# Patient Record
Sex: Female | Born: 1975 | Race: Asian | Hispanic: No | Marital: Married | Smoking: Never smoker
Health system: Southern US, Community
[De-identification: ages and names within clinical notes are randomized; demographics above are authoritative.]

## PROBLEM LIST (undated history)

## (undated) ENCOUNTER — Inpatient Hospital Stay (HOSPITAL_COMMUNITY): Payer: BC Managed Care – PPO

## (undated) DIAGNOSIS — Z789 Other specified health status: Secondary | ICD-10-CM

## (undated) HISTORY — PX: LAPAROSCOPY FOR ECTOPIC PREGNANCY: SUR765

---

## 2013-01-20 ENCOUNTER — Other Ambulatory Visit (HOSPITAL_COMMUNITY): Payer: Self-pay | Admitting: Family Medicine

## 2013-01-20 ENCOUNTER — Ambulatory Visit (HOSPITAL_COMMUNITY)
Admission: RE | Admit: 2013-01-20 | Discharge: 2013-01-20 | Disposition: A | Discharge status: Home / Self Care | Payer: BC Managed Care – PPO | Source: Ambulatory Visit | Attending: Family Medicine | Admitting: Family Medicine

## 2013-01-20 DIAGNOSIS — N949 Unspecified condition associated with female genital organs and menstrual cycle: Secondary | ICD-10-CM | POA: Insufficient documentation

## 2013-01-20 DIAGNOSIS — R102 Pelvic and perineal pain: Secondary | ICD-10-CM

## 2013-05-10 ENCOUNTER — Other Ambulatory Visit: Payer: Self-pay | Admitting: Obstetrics and Gynecology

## 2013-05-10 DIAGNOSIS — R928 Other abnormal and inconclusive findings on diagnostic imaging of breast: Secondary | ICD-10-CM

## 2013-05-24 ENCOUNTER — Ambulatory Visit
Admission: RE | Admit: 2013-05-24 | Discharge: 2013-05-24 | Disposition: A | Discharge status: Home / Self Care | Payer: BC Managed Care – PPO | Source: Ambulatory Visit | Attending: Obstetrics and Gynecology | Admitting: Obstetrics and Gynecology

## 2013-05-24 ENCOUNTER — Other Ambulatory Visit: Payer: BC Managed Care – PPO

## 2013-05-24 DIAGNOSIS — R928 Other abnormal and inconclusive findings on diagnostic imaging of breast: Secondary | ICD-10-CM

## 2014-06-21 ENCOUNTER — Other Ambulatory Visit: Payer: Self-pay | Admitting: Family Medicine

## 2014-06-21 DIAGNOSIS — E069 Thyroiditis, unspecified: Secondary | ICD-10-CM

## 2014-06-23 ENCOUNTER — Ambulatory Visit
Admission: RE | Admit: 2014-06-23 | Discharge: 2014-06-23 | Disposition: A | Discharge status: Home / Self Care | Payer: BC Managed Care – PPO | Source: Ambulatory Visit | Attending: Family Medicine | Admitting: Family Medicine

## 2014-06-23 DIAGNOSIS — E069 Thyroiditis, unspecified: Secondary | ICD-10-CM

## 2014-06-28 ENCOUNTER — Other Ambulatory Visit: Payer: Self-pay | Admitting: Family Medicine

## 2014-06-28 DIAGNOSIS — E041 Nontoxic single thyroid nodule: Secondary | ICD-10-CM

## 2014-07-04 ENCOUNTER — Other Ambulatory Visit (HOSPITAL_COMMUNITY)
Admission: RE | Admit: 2014-07-04 | Discharge: 2014-07-04 | Disposition: A | Discharge status: Home / Self Care | Payer: BC Managed Care – PPO | Source: Ambulatory Visit | Attending: Interventional Radiology | Admitting: Interventional Radiology

## 2014-07-04 ENCOUNTER — Ambulatory Visit
Admission: RE | Admit: 2014-07-04 | Discharge: 2014-07-04 | Disposition: A | Discharge status: Home / Self Care | Payer: BC Managed Care – PPO | Source: Ambulatory Visit | Attending: Family Medicine | Admitting: Family Medicine

## 2014-07-04 DIAGNOSIS — E069 Thyroiditis, unspecified: Secondary | ICD-10-CM | POA: Insufficient documentation

## 2014-07-04 DIAGNOSIS — E041 Nontoxic single thyroid nodule: Secondary | ICD-10-CM

## 2015-03-04 IMAGING — US US SOFT TISSUE HEAD/NECK
1 series · 14 of 25 positions shown · non-contrast
Comparison: None.

CLINICAL DATA: THYROIDITIS

EXAM:
THYROID ULTRASOUND
TECHNIQUE: Ultrasound examination of the thyroid gland and adjacent soft
tissues was performed.

[Series 1: us soft tissue head/neck · 0.08mm/px · 14 of 69 slices shown]
[im 1/69]
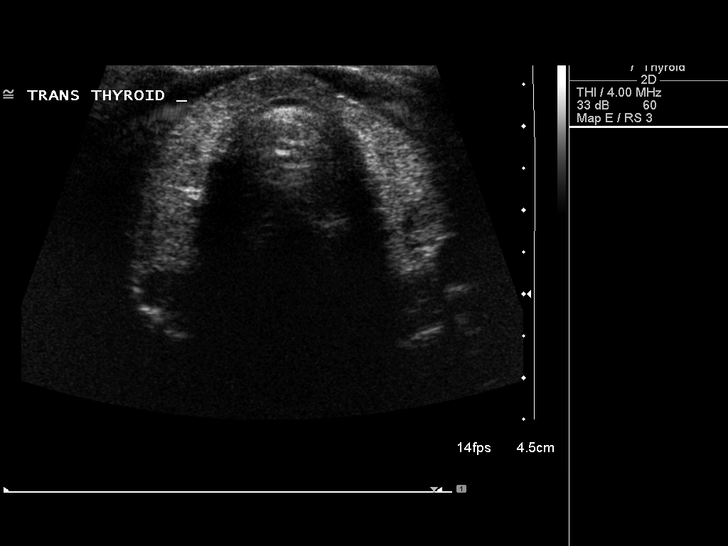
[im 6/69]
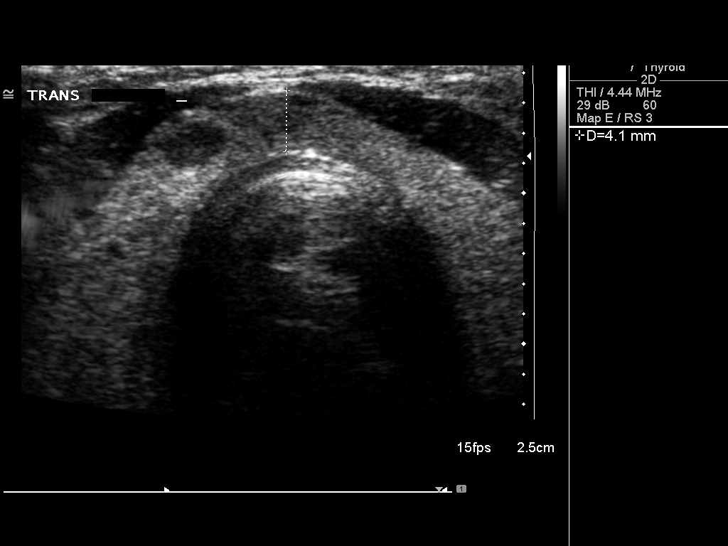
[im 12/69]
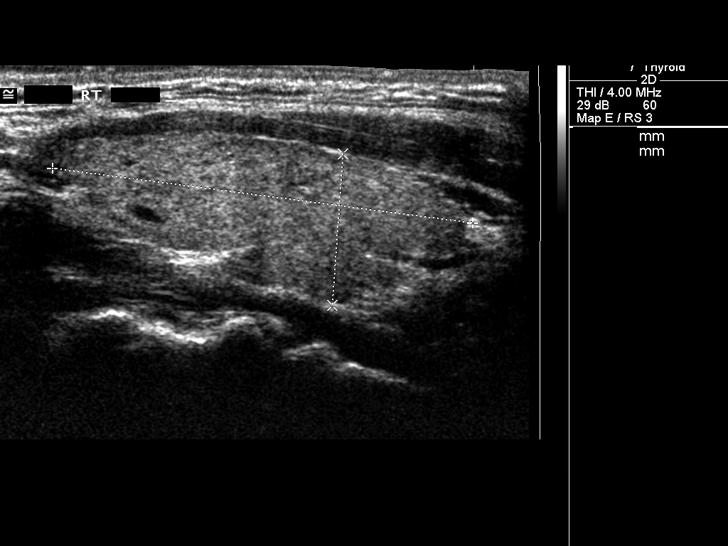
[im 18/69]
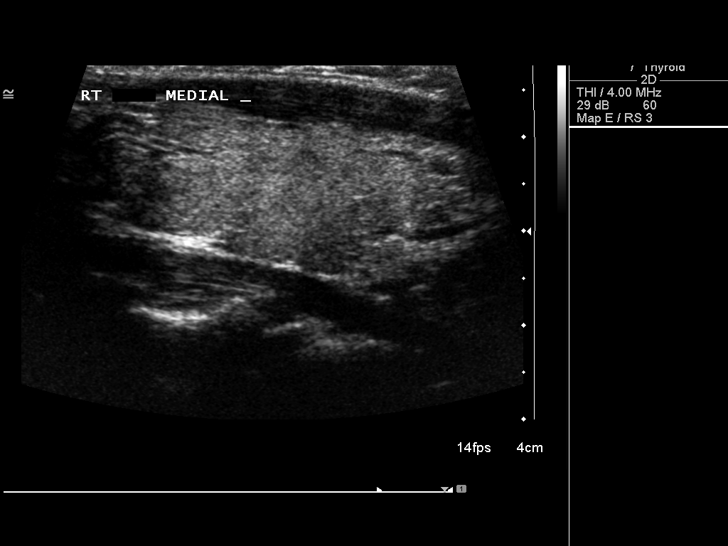
[im 23/69]
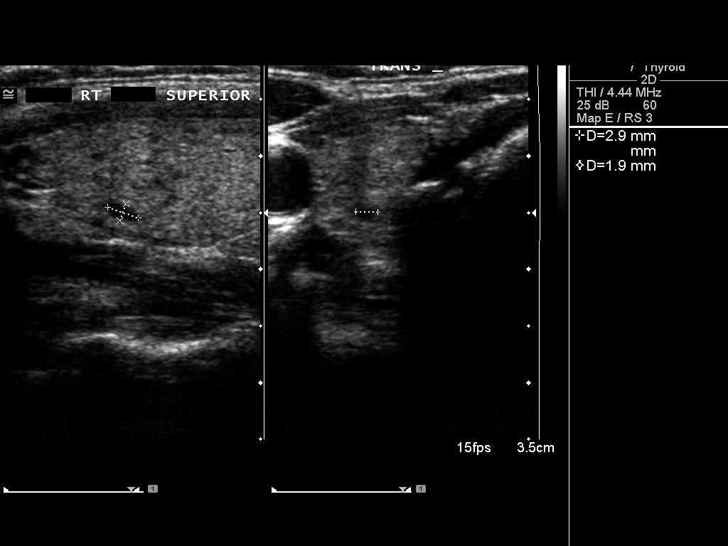
[im 26/69]
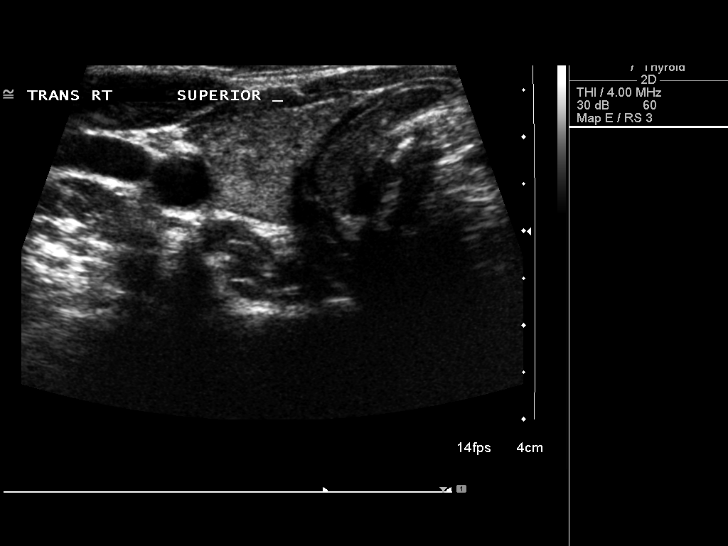
[im 32/69]
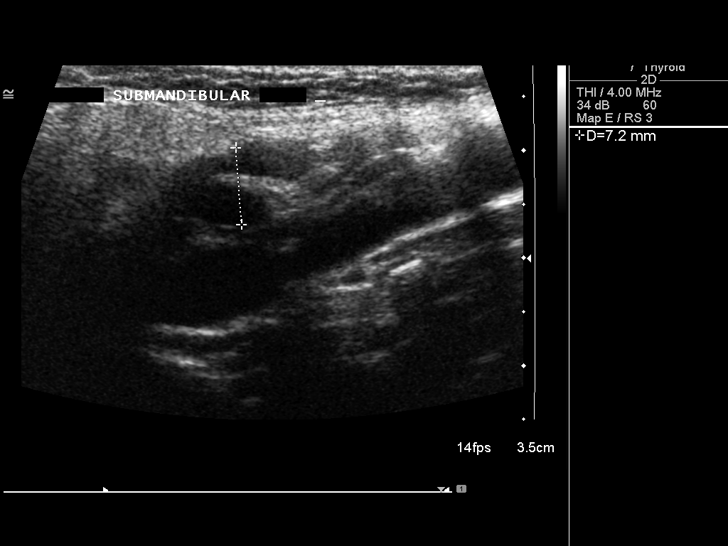
[im 37/69]
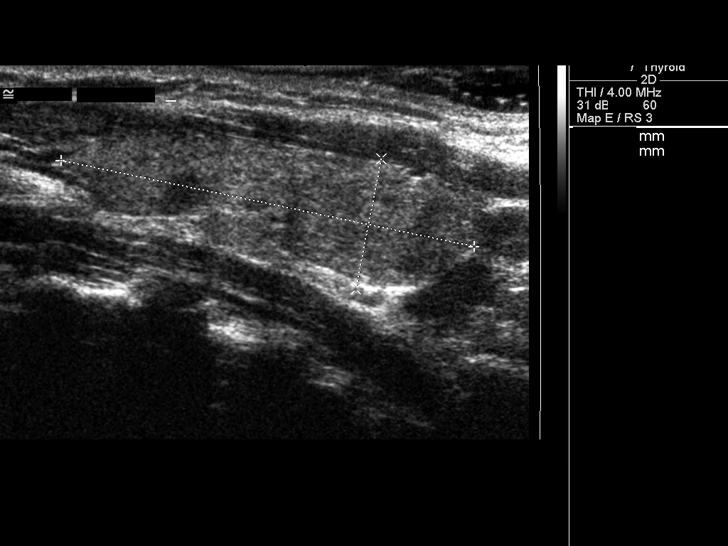
[im 43/69]
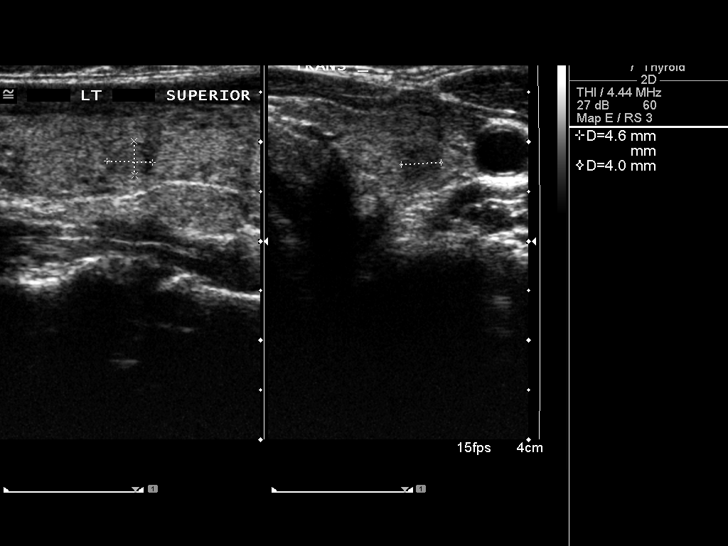
[im 46/69]
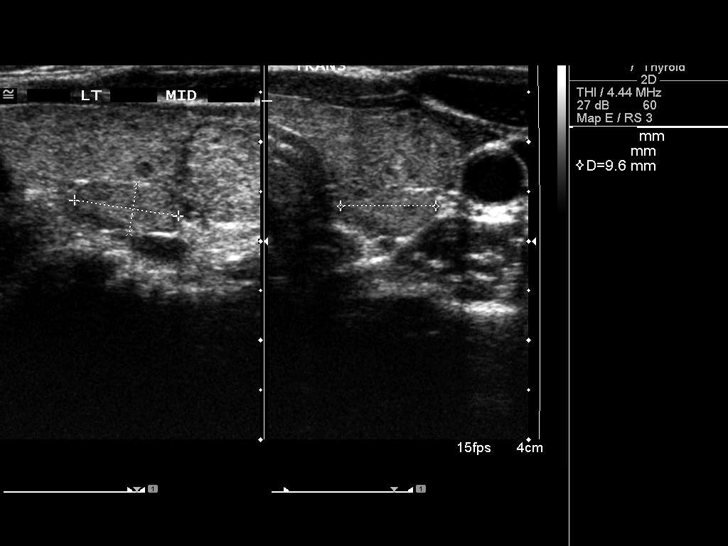
[im 52/69]
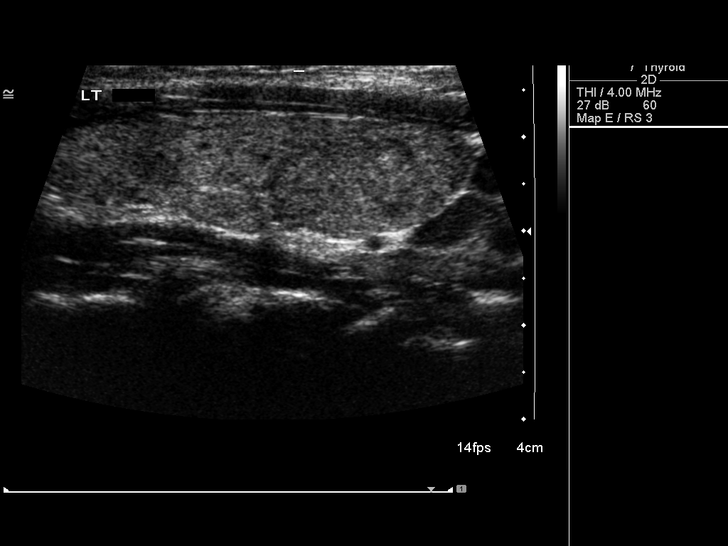
[im 57/69]
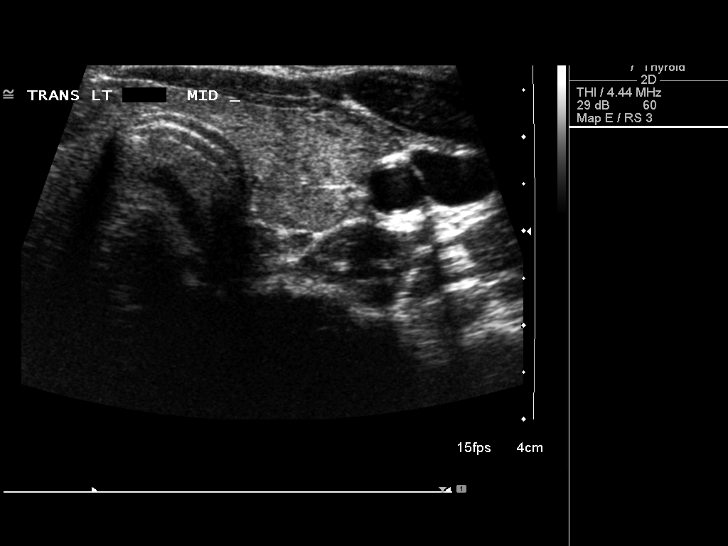
[im 63/69]
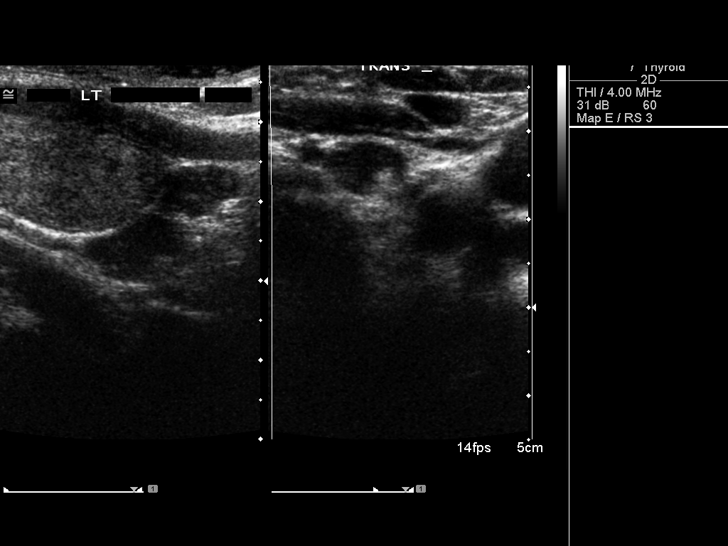
[im 69/69]
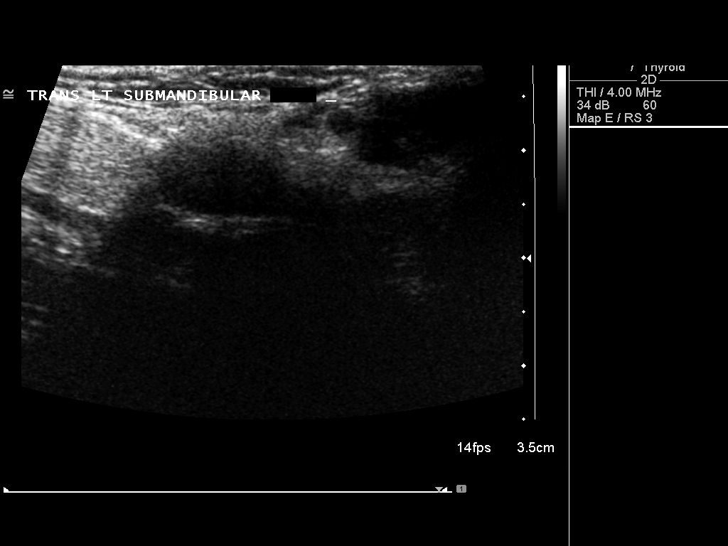

[14 of 25 positions shown; findings below may reference images not displayed]

FINDINGS: Right thyroid lobe

Measurements: 43 x 15 x 15 mm. Mildly inhomogeneous echotexture. 9 x
4 x 6 mm solid nodule, superior pole. Just deep to this is 3 x 2 mm
hypoechoic nodule.

Left thyroid lobe

Measurements: 48 x 15 x 14 mm. Inhomogeneous. 5 x 3 x4 mm nodule,
superior pole. 11 x 5 x 10 mm nodule, deep mid lobe. 23 x 13 x 14 mm
solid nodule, inferior pole.

Isthmus

Thickness: 4 mm.  5 x 4 mm nodule, right of midline.

Lymphadenopathy

None visualized.
IMPRESSION: 1. Normal-sized thyroid with several nodules bilaterally. The
dominant left lesion meets consensus criteria for biopsy.
Ultrasound-guided fine needle aspiration should be considered, as
per the consensus statement: Management of Thyroid Nodules Detected
at US: Society of Radiologists in Ultrasound Consensus Conference

## 2015-03-15 IMAGING — US US THYROID BIOPSY
1 series · 14 of 14 positions shown · non-contrast
Comparison: Prior thyroid ultrasound 06/23/2014

CLINICAL DATA: 38-year-old female with a dominant left solid
thyroid nodule which meet consensus criteria for ultrasound-guided
FNA biopsy

EXAM:
ULTRASOUND GUIDED NEEDLE ASPIRATE BIOPSY OF THE THYROID GLAND

[Series 1: us thyroid biopsy · 0.07mm/px · 14 acquisitions, 14 frames shown]
[im 1/14]
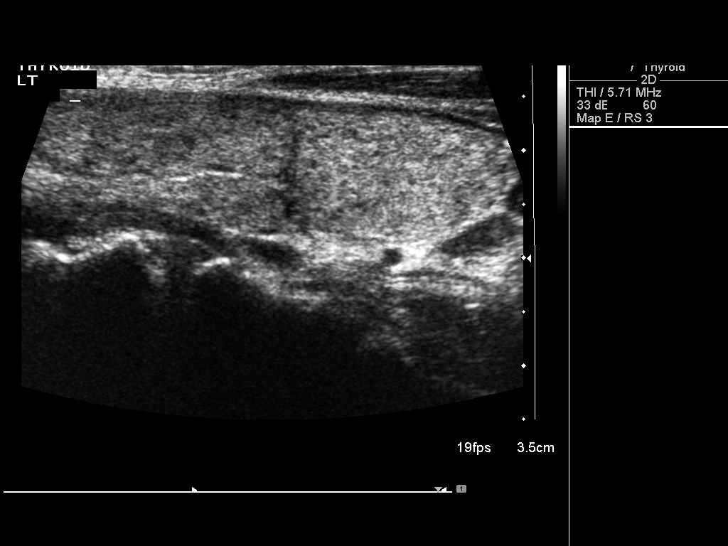
[im 2/14]
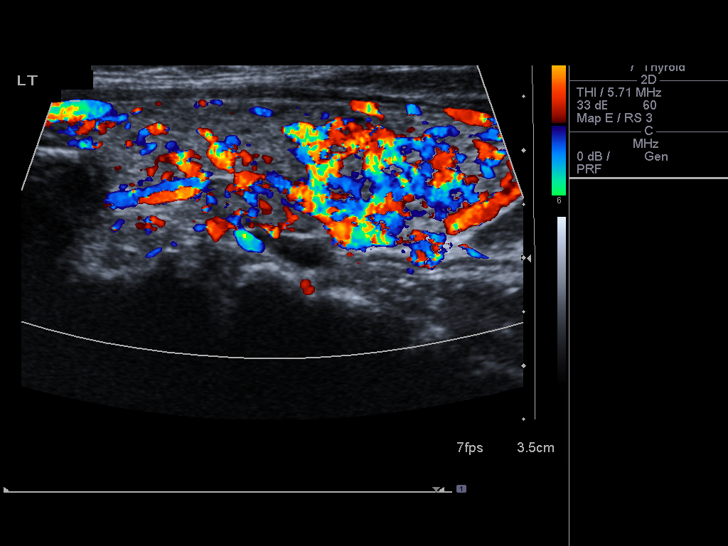
[im 3/14]
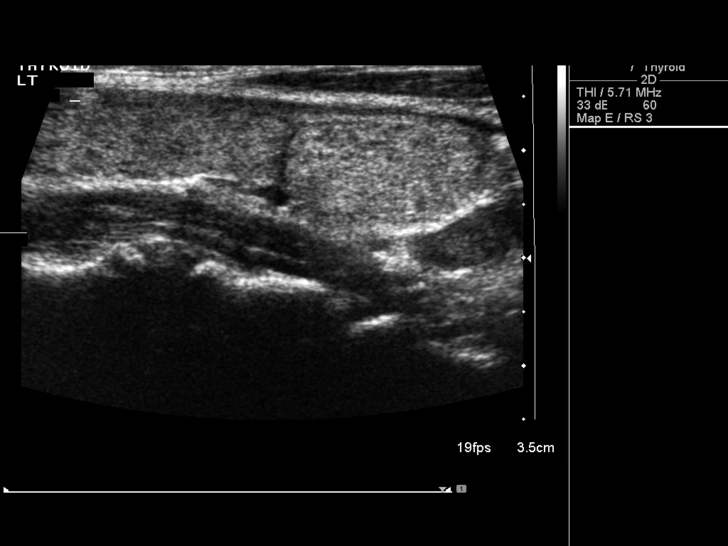
[im 4/14]
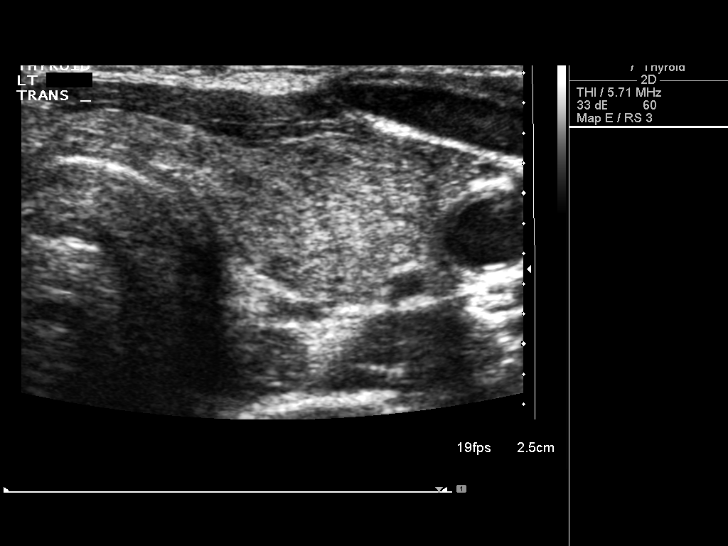
[im 5/14]
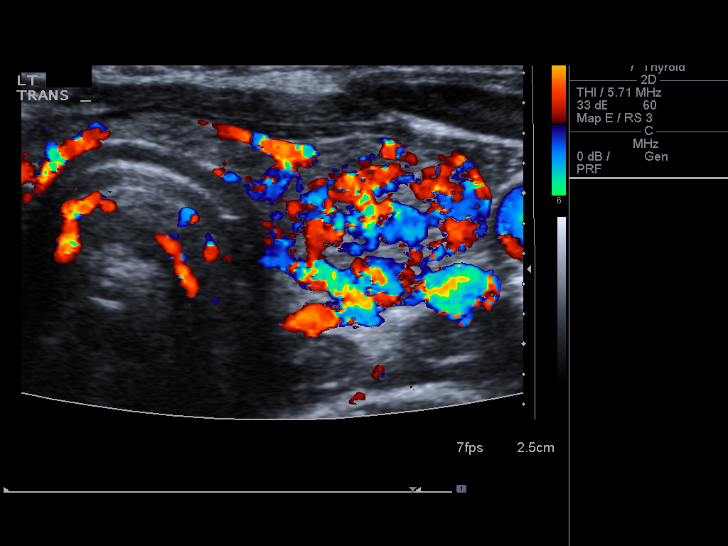
[im 6/14]
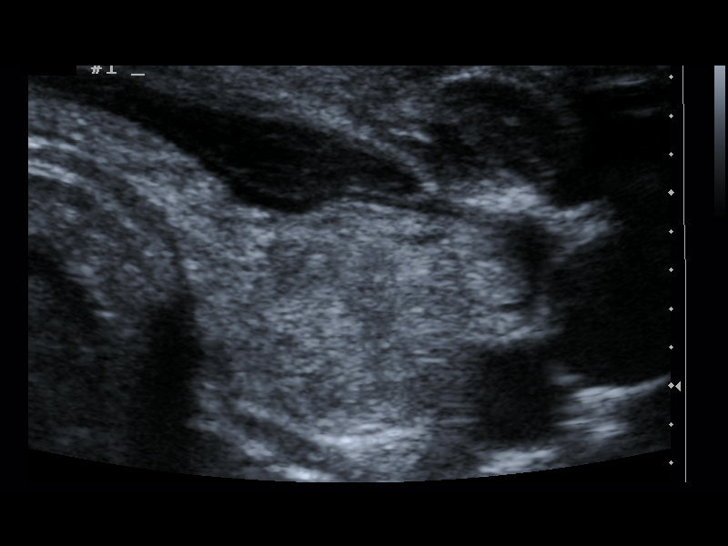
[im 7/14]
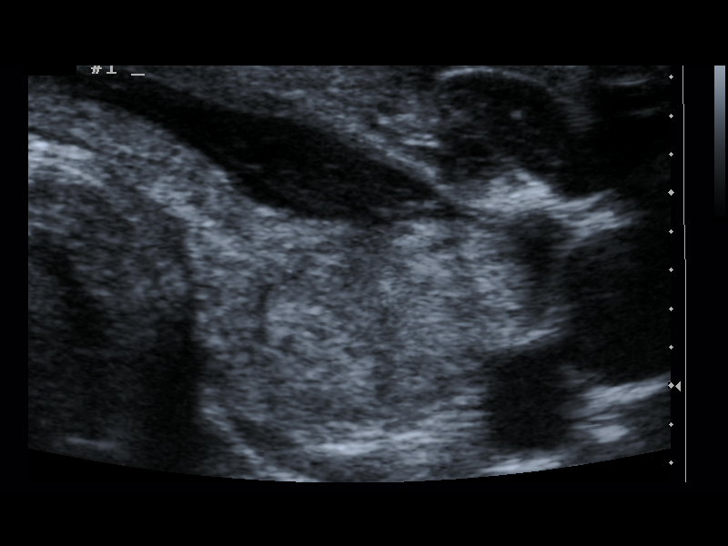
[im 8/14]
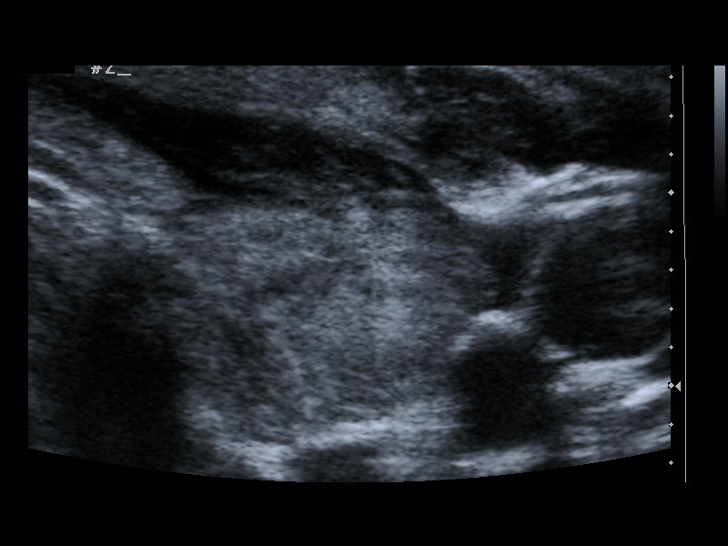
[im 9/14]
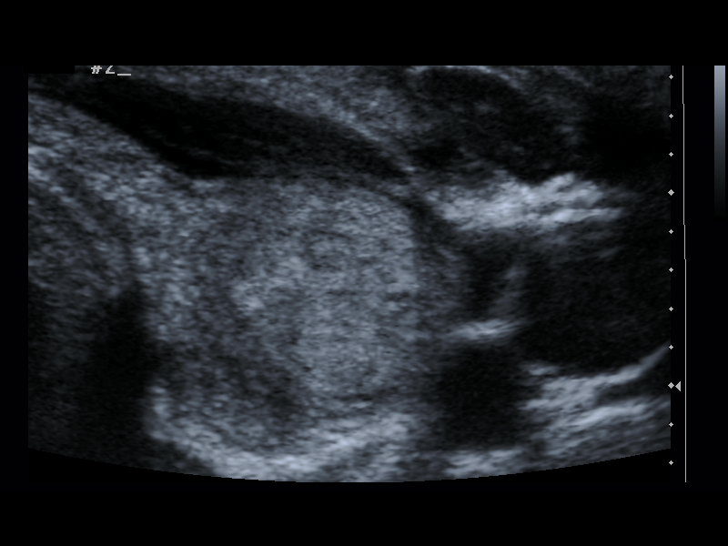
[im 10/14]
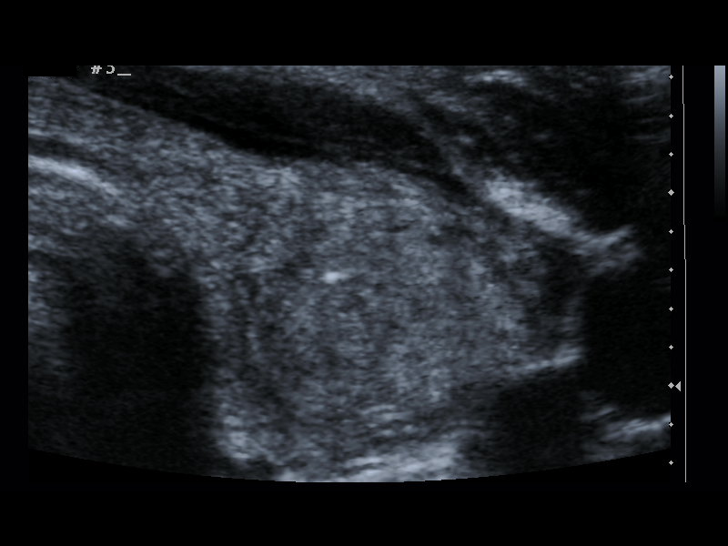
[im 11/14]
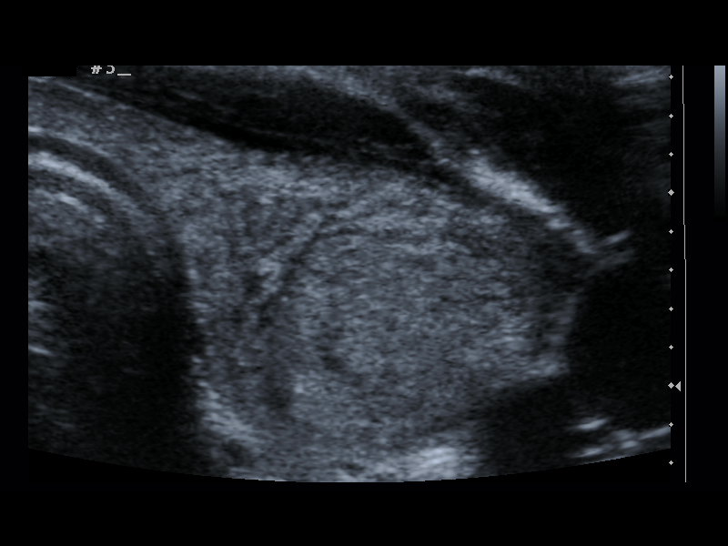
[im 12/14]
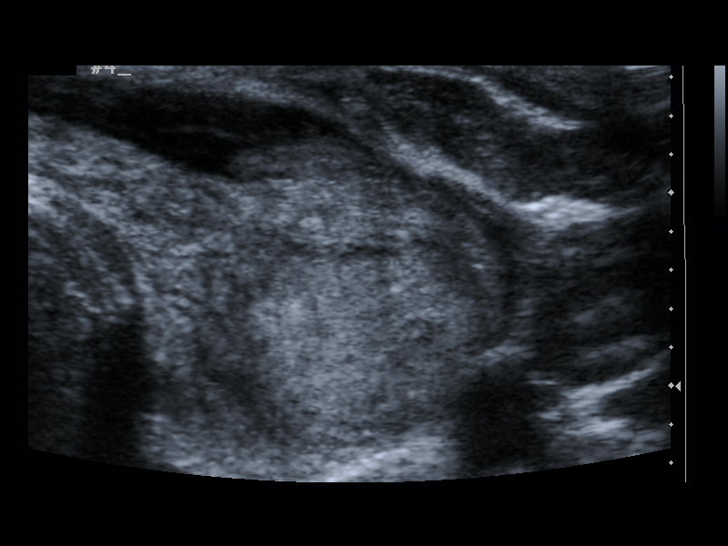
[im 13/14]
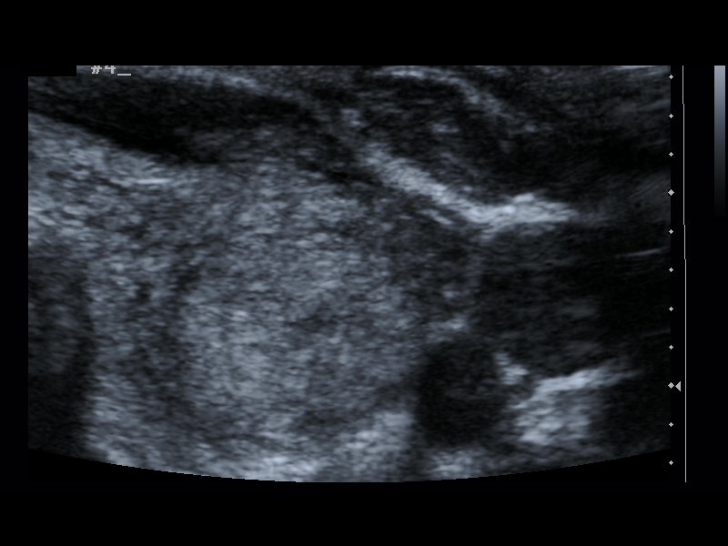
[im 14/14]
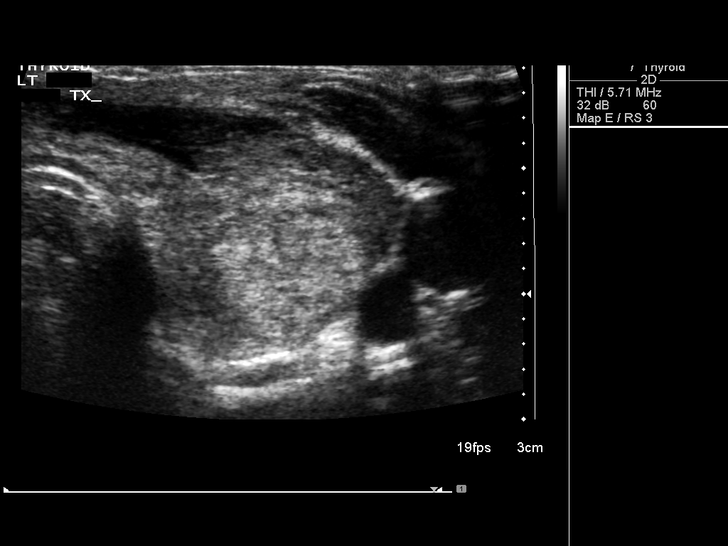

[14 of 14 positions shown; findings below may reference images not displayed]

PROCEDURE:
Thyroid biopsy was thoroughly discussed with the patient and
questions were answered. The benefits, risks, alternatives, and
complications were also discussed. The patient understands and
wishes to proceed with the procedure. Written consent was obtained.



Complications:  None.
FINDINGS: Successful identification of 2.3 cm solid nodule in the inferior
left gland
IMPRESSION: Ultrasound guided needle aspirate biopsy performed of the left
thyroid nodule.

## 2015-09-11 LAB — OB RESULTS CONSOLE HIV ANTIBODY (ROUTINE TESTING): HIV: NONREACTIVE

## 2015-09-11 LAB — OB RESULTS CONSOLE ABO/RH: RH TYPE: POSITIVE

## 2015-09-11 LAB — OB RESULTS CONSOLE RUBELLA ANTIBODY, IGM: Rubella: IMMUNE

## 2015-09-11 LAB — OB RESULTS CONSOLE RPR: RPR: NONREACTIVE

## 2015-09-11 LAB — OB RESULTS CONSOLE HEPATITIS B SURFACE ANTIGEN: Hepatitis B Surface Ag: NEGATIVE

## 2015-09-11 LAB — OB RESULTS CONSOLE ANTIBODY SCREEN: Antibody Screen: NEGATIVE

## 2015-09-14 LAB — OB RESULTS CONSOLE GC/CHLAMYDIA
Chlamydia: NEGATIVE
GC PROBE AMP, GENITAL: NEGATIVE

## 2015-12-02 NOTE — L&D Delivery Note (Signed)
Operative Delivery Note At 1:37 PM a viable and healthy female was delivered via Vaginal, Vacuum Investment banker, operational(Extractor).  Presentation: vertex; Position: Left,, Occiput,, Anterior; Station: +4.  Verbal consent: obtained from patient.  Risks and benefits discussed in detail.  Risks include, but are not limited to the risks of anesthesia, bleeding, infection, damage to maternal tissues, fetal cephalhematoma.  There is also the risk of inability to effect vaginal delivery of the head, or shoulder dystocia that cannot be resolved by established maneuvers, leading to the need for emergency cesarean section.  APGAR: 8, 9; weight  .   Placenta status: Intact, Spontaneous.   Cord: 3 vessels with the following complications: None.  Cord pH: na  Anesthesia: Epidural  Instruments: kiwi x 3pulls, no pop offs Episiotomy: None Lacerations: 2nd degree;Perineal Suture Repair: 2.0 vicryl rapide Est. Blood Loss (mL):  100  Mom to postpartum.  Baby to Couplet care / Skin to Skin.  Jonnathan Birman J 04/05/2016, 1:59 PM

## 2016-03-12 LAB — OB RESULTS CONSOLE GBS: STREP GROUP B AG: NEGATIVE

## 2016-04-05 ENCOUNTER — Inpatient Hospital Stay (HOSPITAL_COMMUNITY): Payer: BC Managed Care – PPO | Admitting: Anesthesiology

## 2016-04-05 ENCOUNTER — Inpatient Hospital Stay (HOSPITAL_COMMUNITY)
Admission: AD | Admit: 2016-04-05 | Discharge: 2016-04-07 | DRG: 775 | Disposition: A | Discharge status: Home / Self Care | Payer: BC Managed Care – PPO | Source: Ambulatory Visit | Attending: Obstetrics and Gynecology | Admitting: Obstetrics and Gynecology

## 2016-04-05 ENCOUNTER — Encounter (HOSPITAL_COMMUNITY): Payer: Self-pay | Admitting: *Deleted

## 2016-04-05 DIAGNOSIS — Z3A39 39 weeks gestation of pregnancy: Secondary | ICD-10-CM | POA: Diagnosis not present

## 2016-04-05 DIAGNOSIS — E039 Hypothyroidism, unspecified: Secondary | ICD-10-CM | POA: Diagnosis present

## 2016-04-05 DIAGNOSIS — O99284 Endocrine, nutritional and metabolic diseases complicating childbirth: Secondary | ICD-10-CM | POA: Diagnosis present

## 2016-04-05 HISTORY — DX: Other specified health status: Z78.9

## 2016-04-05 LAB — ABO/RH: ABO/RH(D): AB POS

## 2016-04-05 LAB — CBC
HCT: 37 % (ref 36.0–46.0)
Hemoglobin: 13 g/dL (ref 12.0–15.0)
MCH: 31 pg (ref 26.0–34.0)
MCHC: 35.1 g/dL (ref 30.0–36.0)
MCV: 88.3 fL (ref 78.0–100.0)
PLATELETS: 183 10*3/uL (ref 150–400)
RBC: 4.19 MIL/uL (ref 3.87–5.11)
RDW: 13 % (ref 11.5–15.5)
WBC: 13.5 10*3/uL — AB (ref 4.0–10.5)

## 2016-04-05 LAB — TYPE AND SCREEN
ABO/RH(D): AB POS
ANTIBODY SCREEN: NEGATIVE

## 2016-04-05 LAB — RPR: RPR: NONREACTIVE

## 2016-04-05 MED ORDER — ACETAMINOPHEN 325 MG PO TABS
650.0000 mg | ORAL_TABLET | ORAL | Status: DC | PRN
Start: 2016-04-05 — End: 2016-04-07

## 2016-04-05 MED ORDER — WITCH HAZEL-GLYCERIN EX PADS
1.0000 "application " | MEDICATED_PAD | CUTANEOUS | Status: DC | PRN
  Administered 2016-04-06: 1 via TOPICAL

## 2016-04-05 MED ORDER — EPHEDRINE 5 MG/ML INJ
10.0000 mg | INTRAVENOUS | Status: DC | PRN
  Filled 2016-04-05: qty 2

## 2016-04-05 MED ORDER — SIMETHICONE 80 MG PO CHEW: 80.0000 mg | CHEWABLE_TABLET | ORAL | Status: DC | PRN

## 2016-04-05 MED ORDER — PHENYLEPHRINE 40 MCG/ML (10ML) SYRINGE FOR IV PUSH (FOR BLOOD PRESSURE SUPPORT)
80.0000 ug | PREFILLED_SYRINGE | INTRAVENOUS | Status: DC | PRN
  Filled 2016-04-05: qty 5

## 2016-04-05 MED ORDER — PRENATAL MULTIVITAMIN CH
1.0000 | ORAL_TABLET | Freq: Every day | ORAL | Status: DC
  Administered 2016-04-06 – 2016-04-07 (×2): 1 via ORAL
  Filled 2016-04-05 (×2): qty 1

## 2016-04-05 MED ORDER — LIDOCAINE HCL (PF) 1 % IJ SOLN
30.0000 mL | INTRAMUSCULAR | Status: DC | PRN
  Filled 2016-04-05: qty 30

## 2016-04-05 MED ORDER — OXYCODONE-ACETAMINOPHEN 5-325 MG PO TABS: 2.0000 | ORAL_TABLET | ORAL | Status: DC | PRN

## 2016-04-05 MED ORDER — DIBUCAINE 1 % RE OINT
1.0000 "application " | TOPICAL_OINTMENT | RECTAL | Status: DC | PRN
  Administered 2016-04-06: 1 via RECTAL
  Filled 2016-04-05: qty 28

## 2016-04-05 MED ORDER — FENTANYL 2.5 MCG/ML BUPIVACAINE 1/10 % EPIDURAL INFUSION (WH - ANES)
INTRAMUSCULAR | Status: AC
  Filled 2016-04-05: qty 125

## 2016-04-05 MED ORDER — LACTATED RINGERS IV SOLN
500.0000 mL | Freq: Once | INTRAVENOUS | Status: AC
  Administered 2016-04-05: 500 mL via INTRAVENOUS

## 2016-04-05 MED ORDER — LIDOCAINE HCL (PF) 1 % IJ SOLN
INTRAMUSCULAR | Status: DC | PRN
  Administered 2016-04-05 (×2): 4 mL via EPIDURAL
  Administered 2016-04-05: 2 mL via EPIDURAL

## 2016-04-05 MED ORDER — DIPHENHYDRAMINE HCL 25 MG PO CAPS: 25.0000 mg | ORAL_CAPSULE | Freq: Four times a day (QID) | ORAL | Status: DC | PRN

## 2016-04-05 MED ORDER — METHYLERGONOVINE MALEATE 0.2 MG PO TABS: 0.2000 mg | ORAL_TABLET | ORAL | Status: DC | PRN

## 2016-04-05 MED ORDER — ZOLPIDEM TARTRATE 5 MG PO TABS: 5.0000 mg | ORAL_TABLET | Freq: Every evening | ORAL | Status: DC | PRN

## 2016-04-05 MED ORDER — OXYCODONE-ACETAMINOPHEN 5-325 MG PO TABS
1.0000 | ORAL_TABLET | ORAL | Status: DC | PRN
  Administered 2016-04-05 – 2016-04-06 (×2): 1 via ORAL
  Filled 2016-04-05 (×2): qty 1

## 2016-04-05 MED ORDER — LACTATED RINGERS IV SOLN
INTRAVENOUS | Status: DC
  Administered 2016-04-05: 125 mL/h via INTRAVENOUS

## 2016-04-05 MED ORDER — IBUPROFEN 600 MG PO TABS
600.0000 mg | ORAL_TABLET | Freq: Four times a day (QID) | ORAL | Status: DC
  Administered 2016-04-05 – 2016-04-07 (×8): 600 mg via ORAL
  Filled 2016-04-05 (×8): qty 1

## 2016-04-05 MED ORDER — ACETAMINOPHEN 325 MG PO TABS: 650.0000 mg | ORAL_TABLET | ORAL | Status: DC | PRN

## 2016-04-05 MED ORDER — OXYTOCIN BOLUS FROM INFUSION
500.0000 mL | INTRAVENOUS | Status: DC
  Administered 2016-04-05: 500 mL via INTRAVENOUS

## 2016-04-05 MED ORDER — PHENYLEPHRINE 40 MCG/ML (10ML) SYRINGE FOR IV PUSH (FOR BLOOD PRESSURE SUPPORT)
PREFILLED_SYRINGE | INTRAVENOUS | Status: DC
Start: 2016-04-05 — End: 2016-04-05
  Filled 2016-04-05: qty 20

## 2016-04-05 MED ORDER — LEVOTHYROXINE SODIUM 50 MCG PO TABS
50.0000 ug | ORAL_TABLET | Freq: Every day | ORAL | Status: DC
  Administered 2016-04-07: 50 ug via ORAL
  Filled 2016-04-05 (×3): qty 1

## 2016-04-05 MED ORDER — ONDANSETRON HCL 4 MG/2ML IJ SOLN: 4.0000 mg | Freq: Four times a day (QID) | INTRAMUSCULAR | Status: DC | PRN

## 2016-04-05 MED ORDER — BENZOCAINE-MENTHOL 20-0.5 % EX AERO
1.0000 "application " | INHALATION_SPRAY | CUTANEOUS | Status: DC | PRN
  Administered 2016-04-05: 1 via TOPICAL
  Filled 2016-04-05: qty 56

## 2016-04-05 MED ORDER — CITRIC ACID-SODIUM CITRATE 334-500 MG/5ML PO SOLN: 30.0000 mL | ORAL | Status: DC | PRN

## 2016-04-05 MED ORDER — SENNOSIDES-DOCUSATE SODIUM 8.6-50 MG PO TABS
2.0000 | ORAL_TABLET | ORAL | Status: DC
  Administered 2016-04-05 – 2016-04-06 (×2): 2 via ORAL
  Filled 2016-04-05 (×2): qty 2

## 2016-04-05 MED ORDER — TETANUS-DIPHTH-ACELL PERTUSSIS 5-2.5-18.5 LF-MCG/0.5 IM SUSP: 0.5000 mL | Freq: Once | INTRAMUSCULAR | Status: DC

## 2016-04-05 MED ORDER — COCONUT OIL OIL
1.0000 "application " | TOPICAL_OIL | Status: DC | PRN
  Administered 2016-04-06: 1 via TOPICAL
  Filled 2016-04-05: qty 120

## 2016-04-05 MED ORDER — ONDANSETRON HCL 4 MG PO TABS: 4.0000 mg | ORAL_TABLET | ORAL | Status: DC | PRN

## 2016-04-05 MED ORDER — LACTATED RINGERS IV SOLN
500.0000 mL | INTRAVENOUS | Status: DC | PRN
  Administered 2016-04-05: 1000 mL via INTRAVENOUS

## 2016-04-05 MED ORDER — DIPHENHYDRAMINE HCL 50 MG/ML IJ SOLN: 12.5000 mg | INTRAMUSCULAR | Status: DC | PRN

## 2016-04-05 MED ORDER — FENTANYL 2.5 MCG/ML BUPIVACAINE 1/10 % EPIDURAL INFUSION (WH - ANES)
14.0000 mL/h | INTRAMUSCULAR | Status: DC | PRN
  Administered 2016-04-05: 14 mL/h via EPIDURAL

## 2016-04-05 MED ORDER — ONDANSETRON HCL 4 MG/2ML IJ SOLN: 4.0000 mg | INTRAMUSCULAR | Status: DC | PRN

## 2016-04-05 MED ORDER — OXYCODONE-ACETAMINOPHEN 5-325 MG PO TABS: 1.0000 | ORAL_TABLET | ORAL | Status: DC | PRN

## 2016-04-05 MED ORDER — METHYLERGONOVINE MALEATE 0.2 MG/ML IJ SOLN: 0.2000 mg | INTRAMUSCULAR | Status: DC | PRN

## 2016-04-05 MED ORDER — FLEET ENEMA 7-19 GM/118ML RE ENEM: 1.0000 | ENEMA | RECTAL | Status: DC | PRN

## 2016-04-05 MED ORDER — OXYTOCIN 10 UNIT/ML IJ SOLN
2.5000 [IU]/h | INTRAVENOUS | Status: DC
  Administered 2016-04-05: 2.5 [IU]/h via INTRAVENOUS
  Filled 2016-04-05: qty 4

## 2016-04-05 NOTE — Lactation Note (Addendum)
This note was copied from a baby's chart. Lactation Consultation Note  P1 , Baby 7 hours old.  Mother states "she does not have enough milk so baby does not want to eat." Reviewed hand expression and explained to parents stomach size and appropriate volume and feeding pattern per age. Mother was able to express drops. Assisted w/ latching baby in laid back, cradle and football positions STS. Sucks and a few swallows observed. Assisted mother keeping baby deep on breast, he came off and on a few times. Demonstrated how to massage/compress breast until she views swallows. Encouraged to continue STS and rest between feedings. Mom encouraged to feed baby 8-12 times/24 hours and with feeding cues.  Discussed basics and cluster feeding. Mom made aware of O/P services, breastfeeding support groups, community resources, and our phone # for post-discharge questions.     Patient Name: Gina Ward [id number]'s Date: 04/05/2016 Reason for consult: Initial assessment   Maternal Data Has patient been taught Hand Expression?: Yes Does the patient have breastfeeding experience prior to this delivery?: No  Feeding Feeding Type: Breast Fed Length of feed: 12 min  LATCH Score/Interventions Latch: Repeated attempts needed to sustain latch, nipple held in mouth throughout feeding, stimulation needed to elicit sucking reflex.  Audible Swallowing: A few with stimulation  Type of Nipple: Everted at rest and after stimulation  Comfort (Breast/Nipple): Soft / non-tender     Hold (Positioning): Assistance needed to correctly position infant at breast and maintain latch.  LATCH Score: 7  Lactation Tools Discussed/Used     Consult Status Consult Status: Follow-up Date: 04/06/16 Follow-up type: In-patient    Dahlia ByesBerkelhammer, [facility] 04/05/2016, 8:55 PM

## 2016-04-05 NOTE — Anesthesia Procedure Notes (Signed)
Epidural Patient location during procedure: OB  Staffing Anesthesiologist: Taralyn Ferraiolo Performed by: anesthesiologist   Preanesthetic Checklist Completed: patient identified, site marked, surgical consent, pre-op evaluation, timeout performed, IV checked, risks and benefits discussed and monitors and equipment checked  Epidural Patient position: sitting Prep: site prepped and draped and DuraPrep Patient monitoring: continuous pulse ox and blood pressure Approach: midline Location: L3-L4 Injection technique: LOR saline  Needle:  Needle type: Tuohy  Needle gauge: 17 G Needle length: 9 cm and 9 Needle insertion depth: 5 cm cm Catheter type: closed end flexible Catheter size: 19 Gauge Catheter at skin depth: 9.5 cm Test dose: negative  Assessment Sensory level: T8 Events: blood not aspirated, injection not painful, no injection resistance, negative IV test and no paresthesia  Additional Notes Patient identified. Risks/Benefits/Options discussed with patient including but not limited to bleeding, infection, nerve damage, paralysis, failed block, incomplete pain control, headache, blood pressure changes, nausea, vomiting, reactions to medications, itching and postpartum back pain. Confirmed with bedside nurse the patient's most recent platelet count. Confirmed with patient that they are not currently taking any anticoagulation, have any bleeding history or any family history of bleeding disorders. Patient expressed understanding and wished to proceed. All questions were answered. Sterile technique was used throughout the entire procedure. Please see nursing notes for vital signs. Test dose was given through epidural catheter and negative prior to continuing to dose epidural or start infusion. Warning signs of high block given to the patient including shortness of breath, tingling/numbness in hands, complete motor block, or any concerning symptoms with instructions to call for help.  Patient was given instructions on fall risk and not to get out of bed. All questions and concerns addressed with instructions to call with any issues or inadequate analgesia.

## 2016-04-05 NOTE — Progress Notes (Signed)
Gina Ward is a 40 y.o. G2P0010 at [redacted]w[redacted]d by LMP admitted for active labor  Subjective: comfortable  Objective: BP 100/50 mmHg  Pulse 65  Temp(Src) 98.7 F (37.1 C) (Oral)  Resp 16  Ht 5\' 1"  (1.549 m)  Wt 65.59 kg (144 lb 9.6 oz)  BMI 27.34 kg/m2  SpO2 100%      FHT:  FHR: 145 bpm, variability: moderate,  accelerations:  Present,  decelerations:  Absent UC:   regular, every 3 minutes SVE:   Dilation: 9 Effacement (%): 90 Station: -1 Exam by:: s grindstaff rn  arom- clear  Labs: Lab Results  Component Value Date   WBC 13.5* 04/05/2016   HGB 13.0 04/05/2016   HCT 37.0 04/05/2016   MCV 88.3 04/05/2016   PLT 183 04/05/2016    Assessment / Plan: Spontaneous labor, progressing normally  Labor: Progressing normally Preeclampsia:  no signs or symptoms of toxicity Fetal Wellbeing:  Category I Pain Control:  Epidural I/D:  n/a Anticipated MOD:  NSVD  Uchechukwu Dhawan J 04/05/2016, 9:59 AM

## 2016-04-05 NOTE — Progress Notes (Signed)
Vacuum applied

## 2016-04-05 NOTE — H&P (Signed)
Gina Ward is a 40 y.o. female presenting for labor.  Maternal Medical History:  Reason for admission: Contractions.   Contractions: Onset was 6-12 hours ago.   Frequency: irregular.   Perceived severity is moderate.    Fetal activity: Perceived fetal activity is normal.   Last perceived fetal movement was within the past hour.    Prenatal complications: no prenatal complications Prenatal Complications - Diabetes: none.    OB History    Gravida Para Term Preterm AB TAB SAB Ectopic Multiple Living   2    1   1        Past Medical History  Diagnosis Date  . Medical history non-contributory    Past Surgical History  Procedure Laterality Date  . Laparoscopy for ectopic pregnancy     Family History: family history is not on file. Social History:  reports that she has never smoked. She does not have any smokeless tobacco history on file. She reports that she does not use illicit drugs. Her alcohol history is not on file.   Prenatal Transfer Tool  Maternal Diabetes: No Genetic Screening: Normal Maternal Ultrasounds/Referrals: Normal Fetal Ultrasounds or other Referrals:  None Maternal Substance Abuse:  No Significant Maternal Medications:  None Significant Maternal Lab Results:  None Other Comments:  None  Review of Systems  Constitutional: Negative.   All other systems reviewed and are negative.   Dilation: 7 Effacement (%): 90 Station: -1 Exam by:: M.Merrill, RN  Blood pressure 90/58, pulse 70, temperature 98.2 F (36.8 C), temperature source Oral, resp. rate 16, height 5\' 1"  (1.549 m), weight 65.59 kg (144 lb 9.6 oz), SpO2 100 %. Maternal Exam:  Uterine Assessment: Contraction strength is moderate.  Contraction frequency is regular.   Abdomen: Patient reports no abdominal tenderness. Fetal presentation: vertex  Introitus: Normal vulva. Normal vagina.  Ferning test: not done.  Nitrazine test: not done. Amniotic fluid character: not assessed.  Pelvis:  questionable for delivery.   Cervix: Cervix evaluated by digital exam.     Physical Exam  Nursing note and vitals reviewed. Constitutional: She is oriented to person, place, and time. She appears well-developed and well-nourished.  HENT:  Head: Normocephalic and atraumatic.  Neck: Normal range of motion. Neck supple.  Cardiovascular: Normal rate and regular rhythm.   Respiratory: Effort normal and breath sounds normal.  GI: Soft. Bowel sounds are normal.  Genitourinary: Vagina normal and uterus normal.  Musculoskeletal: Normal range of motion.  Neurological: She is alert and oriented to person, place, and time.  Skin: Skin is warm and dry.  Psychiatric: She has a normal mood and affect.    Prenatal labs: ABO, Rh: --/--/AB POS (05/06 0505) Antibody: NEG (05/06 0505) Rubella: Immune (10/11 0000) RPR: Nonreactive (10/11 0000)  HBsAg: Negative (10/11 0000)  HIV: Non-reactive (10/11 0000)  GBS: Negative (04/12 0000)   Assessment/Plan: Term IUP Active Labor Admit   Gina Ward 04/05/2016, 8:21 AM

## 2016-04-05 NOTE — Anesthesia Pain Management Evaluation Note (Signed)
  CRNA Pain Management Visit Note  Patient: Gina Ward, 40 y.o., female  "[facility]. We have an anesthesia team available at all times to provide care throughout the hospital, including epidural management and anesthesia for C-section. I don't know your plan for the delivery whether it a natural birth, water birth, IV sedation, nitrous supplementation, doula or epidural, but we want to meet your pain goals."   1.Was your pain managed to your expectations on prior hospitalizations?   Yes   2.What is your expectation for pain management during this hospitalization?     Epidural  3.How can we help you reach that goal? epidural  Record the patient's initial score and the patient's pain goal.   Pain: 0  Pain Goal: 4 [facility] wants you to be able to say your pain was always managed very well.  Yan Okray 04/05/2016

## 2016-04-05 NOTE — Anesthesia Postprocedure Evaluation (Addendum)
Anesthesia Post Note  Patient: Gina Ward  Procedure(s) Performed: * No procedures listed *  Patient location during evaluation: Mother Baby Anesthesia Type: Epidural Level of consciousness: awake and alert, oriented and patient cooperative Pain management: pain level controlled Vital Signs Assessment: post-procedure vital signs reviewed and stable Respiratory status: spontaneous breathing Cardiovascular status: stable Postop Assessment: no headache, epidural receding, patient able to bend at knees and no signs of nausea or vomiting Anesthetic complications: no Comments: Pain at manageable level.     Last Vitals:  Filed Vitals:   04/05/16 1535 04/05/16 1650  BP: 119/64 102/55  Pulse: 64 75  Temp: 36.7 C 36.9 C  Resp: 20 18    Last Pain:  Filed Vitals:   04/05/16 1803  PainSc: 6    Pain Goal: Patients Stated Pain Goal: 0 (04/05/16 0500)               Merrilyn PumaWRINKLE,[name]

## 2016-04-05 NOTE — Anesthesia Preprocedure Evaluation (Signed)
Anesthesia Evaluation  Patient identified by MRN, date of birth, ID band Patient awake    Reviewed: Allergy & Precautions, H&P , NPO status , Patient's Chart, lab work & pertinent test results  Airway Mallampati: II  TM Distance: >3 FB Neck ROM: full    Dental no notable dental hx.    Pulmonary neg pulmonary ROS,    Pulmonary exam normal breath sounds clear to auscultation       Cardiovascular negative cardio ROS Normal cardiovascular exam Rhythm:regular Rate:Normal     Neuro/Psych negative neurological ROS  negative psych ROS   GI/Hepatic negative GI ROS, Neg liver ROS,   Endo/Other  negative endocrine ROS  Renal/GU negative Renal ROS  negative genitourinary   Musculoskeletal   Abdominal   Peds  Hematology negative hematology ROS (+)   Anesthesia Other Findings Pregnancy - uncomplicated Platelets and allergies reviewed Denies active cardiac or pulmonary symptoms, METS > 4  Denies blood thinning medications, bleeding disorders, hypertension, asthma, supine hypotension syndrome, previous anesthesia difficulties   Reproductive/Obstetrics (+) Pregnancy                             Anesthesia Physical Anesthesia Plan  ASA: II  Anesthesia Plan: Epidural   Post-op Pain Management:    Induction:   Airway Management Planned:   Additional Equipment:   Intra-op Plan:   Post-operative Plan:   Informed Consent: I have reviewed the patients History and Physical, chart, labs and discussed the procedure including the risks, benefits and alternatives for the proposed anesthesia with the patient or authorized representative who has indicated his/her understanding and acceptance.     Plan Discussed with:   Anesthesia Plan Comments:         Anesthesia Quick Evaluation  

## 2016-04-05 NOTE — MAU Note (Signed)
Contractions off and on since 0500 Friday. Have gotten closer and stronger. Some bloody show.

## 2016-04-06 LAB — CBC
HEMATOCRIT: 29.2 % — AB (ref 36.0–46.0)
HEMOGLOBIN: 10 g/dL — AB (ref 12.0–15.0)
MCH: 30.9 pg (ref 26.0–34.0)
MCHC: 34.2 g/dL (ref 30.0–36.0)
MCV: 90.1 fL (ref 78.0–100.0)
Platelets: 141 10*3/uL — ABNORMAL LOW (ref 150–400)
RBC: 3.24 MIL/uL — AB (ref 3.87–5.11)
RDW: 13.4 % (ref 11.5–15.5)
WBC: 11.7 10*3/uL — ABNORMAL HIGH (ref 4.0–10.5)

## 2016-04-06 NOTE — Progress Notes (Signed)
PPD 1 VAVD with 2nd degree LAC repair  S:  Reports feeling ok - some sleep / little cramps             Tolerating po/ No nausea or vomiting             Bleeding is light             Pain controlled with motrin and percocet             Up ad lib / ambulatory / voiding QS  Newborn breast feeding  O:               VS: BP 104/59 mmHg  Pulse 72  Temp(Src) 98 F (36.7 C) (Oral)  Resp 18  Ht 5\' 1"  (1.549 m)  Wt 65.59 kg (144 lb 9.6 oz)  BMI 27.34 kg/m2  SpO2 100%  Breastfeeding? Unknown   LABS:              Recent Labs  04/05/16 0505 04/06/16 0535  WBC 13.5* 11.7*  HGB 13.0 10.0*  PLT 183 141*               Blood type: --/--/AB POS, AB POS (05/06 0505)  Rubella: Immune (10/11 0000)                       Physical Exam:             Alert and oriented X3  Abdomen: soft, non-tender, non-distended              Fundus: firm, non-tender, Ueven  Perineum: mild edema / ice pack in place  Lochia: light  Extremities: no edema, no calf pain or tenderness    A: PPD # 1   Doing well - stable status  P: Routine post partum orders   Marlinda MikeBAILEY, Elwood Bazinet CNM, MSN, [facility] 04/06/2016, 9:36 AM

## 2016-04-07 MED ORDER — IBUPROFEN 600 MG PO TABS: 600.0000 mg | ORAL_TABLET | Freq: Four times a day (QID) | ORAL | Status: AC | PRN

## 2016-04-07 MED ORDER — OXYCODONE-ACETAMINOPHEN 5-325 MG PO TABS: 1.0000 | ORAL_TABLET | ORAL | Status: AC | PRN

## 2016-04-07 MED ORDER — DOCUSATE SODIUM 100 MG PO CAPS: 100.0000 mg | ORAL_CAPSULE | Freq: Every day | ORAL | Status: AC

## 2016-04-07 NOTE — Discharge Summary (Signed)
OB Discharge Summary  Patient Name: Gina Ward DOB: 11/22/1976 MRN: [id number]  Date of admission: 04/05/2016  Admitting diagnosis: Active Labor, G2 P0 0 1 0 Intrauterine pregnancy: [redacted]w[redacted]d     Secondary diagnosis: AMA, Hypothyroid  Date of discharge: 04/07/2016     Discharge diagnosis: VAVD w/ 2nd deg lac     Prenatal history: G2P1011   EDC : 04/08/2016, by Other Basis  [facility] at [facility]  Primary provider : Dr Juliene Pina Prenatal course complicated by [facility], hypothyroidism  Prenatal Labs: ABO, Rh: --/--/AB POS, AB POS (05/06 0505) Antibody: NEG (05/06 0505) Rubella: Immune (10/11 0000) RPR: Non Reactive (05/06 0505)  HBsAg: Negative (10/11 0000)  HIV: Non-reactive (10/11 0000)  GBS: Negative (04/12 0000)                                    Hospital course:  Onset of Labor With Vaginal Delivery     40 y.o. yo G2P1011 at [redacted]w[redacted]d was admitted in Active Labor on 04/05/2016. Patient had an uncomplicated labor course as follows:  Membrane Rupture Time/Date: 9:56 AM ,04/05/2016   Intrapartum Procedures: Episiotomy: None [1]                                         Lacerations:  2nd degree [3];Perineal [11]  Patient had a delivery of a Viable infant. 04/05/2016  Information for the patient's newborn:  Joud, Pettinato [[id number]]  Delivery Method: Vaginal, Vacuum (Extractor) (Filed from Delivery Summary)    Pateint had an uncomplicated postpartum course.  She is ambulating, tolerating a regular diet, passing flatus, and urinating well. Patient is discharged home in stable condition on 04/07/2016.   Augmentation: None Delivering PROVIDER: Olivia Mackie                                                            Complications: None  Newborn Data: Live born female  Birth Weight: 7 lb 9.9 oz (3455 g) APGAR: 8, 9  Baby Feeding: Breast Disposition:home with mother  Post partum procedures:none    Labs: Lab Results  Component Value Date   WBC 11.7*  04/06/2016   HGB 10.0* 04/06/2016   HCT 29.2* 04/06/2016   MCV 90.1 04/06/2016   PLT 141* 04/06/2016   No flowsheet data found.  Physical Exam @ time of discharge:  Filed Vitals:   04/05/16 2000 04/06/16 0345 04/06/16 1825 04/07/16 0529  BP: 105/59 104/59 107/66 117/61  Pulse: 72 72 73 66  Temp: 98.2 F (36.8 C) 98 F (36.7 C) 97.9 F (36.6 C) 98.4 F (36.9 C)  TempSrc: Oral Oral Oral Oral  Resp: Height:      Weight:      SpO2:        General: alert, NAD, cooperative Lochia: appropriate Uterine Fundus: firm Perineum: minimal edema, healing with good reapproximation Rectum: 2 small pink, fleshy hemorrhoids, approx 0.5 cm Incision: N/A DVT Evaluation: No evidence of DVT seen on physical exam. Negative Homan's sign. No significant calf/ankle edema.   Discharge instructions:  "Baby and Me  Booklet" and [facility] Booklet  Discharge Medications:    Medication List    ASK your doctor about these medications        levothyroxine 50 MCG tablet  Commonly known as:  SYNTHROID, LEVOTHROID  Take 50 mcg by mouth daily before breakfast.     prenatal multivitamin Tabs tablet  Take 1 tablet by mouth at bedtime.        Diet: routine diet  Activity: Advance as tolerated. Pelvic rest x 6 weeks.   Follow up:6 weeks    Demetrius RevelFISHER,Georgine Wiltse K, MSN, [facility]NP 04/07/2016, 10:18 AM

## 2016-04-07 NOTE — Lactation Note (Addendum)
This note was copied from a baby's chart. Lactation Consultation Note Baby had lost 7%. Baby has been BF well, had a few areas when had a lasp in BF, d/t newborn. Mom has easy flow of colostrum, good everted nipples, holding in good BF positions. Asked RN to have baby re-weight. Baby does have upper labial frenulum, has a good flange. If has tongue tie, which could be possible it would be posterior. Tongue isn't cupping under gloved finger at first. It takes a little bit before it moves out well into position. Baby has had 2 voids and 3 stools since birth. Encouraged  Mom to BF every 2-3 hrs and more if cueing. stimulate baby if sleepy. Encouraged to be comfortable during BF. Massage breast occasionally during BF. Parents speak great English. Encouraged to keep good I&O. Parents aren't writing down I&O, just telling staff. Weight was re-checked and noted 8% weight loss. Informed RN to set up DEBP. And mom needs to supplement. Patient Name: Gina Ward Reason for consult: Follow-up assessment;Infant weight loss   Maternal Data    Feeding Feeding Type: Breast Fed Length of feed: 10 min (still BF)  LATCH Score/Interventions Latch: Grasps breast easily, tongue down, lips flanged, rhythmical sucking. Intervention(s): Adjust position;Assist with latch  Audible Swallowing: A few with stimulation Intervention(s): Hand expression;Alternate breast massage;Skin to skin  Type of Nipple: Everted at rest and after stimulation  Comfort (Breast/Nipple): Soft / non-tender     Hold (Positioning): Assistance needed to correctly position infant at breast and maintain latch. Intervention(s): Skin to skin;Position options;Breastfeeding basics reviewed;Support Pillows  LATCH Score: 8  Lactation Tools Discussed/Used     Consult Status Consult Status: Follow-up Date: 04/07/16 Follow-up type: In-patient    Adrianah Prophete, Diamond NickelLAURA G Ward, 4:03 AM

## 2016-04-07 NOTE — Progress Notes (Signed)
Patient ID: Gina Ward, female   DOB: 06/21/1976, 40 y.o.   MRN: [id number] PPD # 2  Subjective: Pt reports feeling well / Pain somewhat controlled with ibuprofen.  Has not taken any percocet Tolerating po/ Voiding without problems/ No n/v Bleeding is light/ Newborn info:  Information for the patient's newborn:  Priscille LovelessShrestha, Boy Shontavia [[id number]][[id number]]  female  / circ not desired / Feeding: breast    Objective:  VS: Blood pressure 117/61, pulse 66, temperature 98.4 F (36.9 C), temperature source Oral, resp. rate 16    Recent Labs  04/05/16 0505 04/06/16 0535  WBC 13.5* 11.7*  HGB 13.0 10.0*  HCT 37.0 29.2*  PLT 183 141*    Blood type: --/--/AB POS, AB POS (05/06 0505) Rubella: Immune (10/11 0000)    Physical Exam:  General:  alert, cooperative and no distress CV: Regular rate and rhythm Resp: clear Abdomen: soft, nontender, normal bowel sounds Uterine Fundus: firm, below umbilicus, nontender Perineum: healing with good reapproximation and Rectum with 2 small, fleshy, pink hemorrhoids Lochia: minimal Ext: Homans sign is negative, no sign of DVT and no edema, redness or tenderness in the calves or thighs    A/P: PPD # 2/ G2P1011/ S/P: VAVD w/ 2nd deg repair Doing well and stable for discharge home RX: Ibuprofen 600mg  po Q 6 hrs prn pain #30 Refill x 1 Percocet 5/325 1 to 2 po Q 4 hrs prn pain #15 No refill Colace 100mg  po up to TID prn #30 Ref x 1 WOB/GYN booklet given Routine pp visit in 6wks   Demetrius RevelFISHER,Laural Eiland K, MSN, [facility]NP 04/07/2016, 10:15 AM

## 2016-04-07 NOTE — Lactation Note (Signed)
This note was copied from a baby's chart. Lactation Consultation Note  Patient Name: Gina Ward AOZHY'QToday's Date: 04/07/2016 Reason for consult: Follow-up assessment;Infant weight loss;Other (Comment) (8% weight loss, see LC note )  Baby is 46 hours old and is at 8 % weight loss.  Per baby baby has breast fed recently and supplemented with 10 ml for formula at 1030 .  Per mom has been instructed to supplement after breast feeding .  Discussed with mom and dad the importance of the 1st 2 weeks of breast feeding to establish and protect milk supply.  LC 11-7 had set up a DEBP due to weight loss and per mom explained engorgement prevention and tx.  Per mom feel clear on what to do. LC referred mom and dad to the Baby and me booklet pages 24-25.  Mom undecided about her DEBP pump needs . Per mom has checked with her insurance company and per mom isn't pleased  With the Medela pump / without a carrying bag . LC highly recommended due to the 8% weight loss that a DEBP is indicated  And if she wasn't plan to have longevity with breastfeeding to have a DEBP.to establish and protect milk supply.  LC also recommended renting a DEBP for 2 weeks. LC also recommended after D/C to have dad drive around to the Education  Entrance and stop at the Lactation store to check out pumps. LC gave several options. Mom and dad undecided. LC gave mom pump  Paperwork to fill out.     Maternal Data    Feeding Feeding Type:  (baby recently breast fed at was supplemented per mom ) Length of feed: 30 min  LATCH Score/Interventions Latch: Grasps breast easily, tongue down, lips flanged, rhythmical sucking.  Audible Swallowing: A few with stimulation  Type of Nipple: Everted at rest and after stimulation  Comfort (Breast/Nipple): Soft / non-tender     Hold (Positioning): No assistance needed to correctly position infant at breast. Intervention(s): Breastfeeding basics reviewed  LATCH Score:  9  Lactation Tools Discussed/Used Tools: Pump Breast pump type: Double-Electric Breast Pump WIC Program: No Pump Review: Setup, frequency, and cleaning;Milk Storage (reviewed /reviewed MAI )   Consult Status Consult Status: Complete Date: 04/07/16    Kathrin Greathouseorio, Shakeira Rhee Ann 04/07/2016, 11:59 AM

## 2017-07-31 ENCOUNTER — Other Ambulatory Visit: Payer: Self-pay | Admitting: Endocrinology

## 2017-07-31 DIAGNOSIS — E049 Nontoxic goiter, unspecified: Secondary | ICD-10-CM

## 2017-08-12 ENCOUNTER — Ambulatory Visit
Admission: RE | Admit: 2017-08-12 | Discharge: 2017-08-12 | Disposition: A | Discharge status: Home / Self Care | Payer: BC Managed Care – PPO | Source: Ambulatory Visit | Attending: Endocrinology | Admitting: Endocrinology

## 2017-08-12 DIAGNOSIS — E049 Nontoxic goiter, unspecified: Secondary | ICD-10-CM

## 2018-01-06 IMAGING — US US THYROID
1 series · 13 of 25 positions shown · non-contrast
Comparison: 06/23/2014

CLINICAL DATA: Goiter.

EXAM:
THYROID ULTRASOUND
TECHNIQUE: Ultrasound examination of the thyroid gland and adjacent soft
tissues was performed.

[Series 1: us thyroid · 0.04mm/px · 13 of 44 slices shown]
[im 1/44]
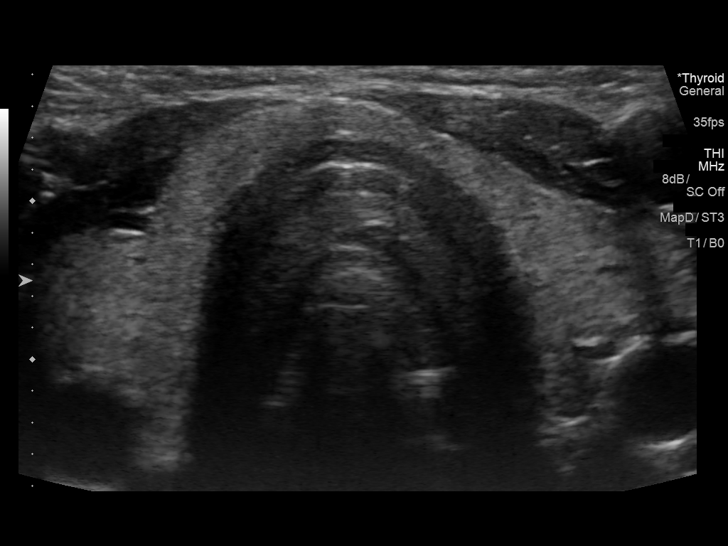
[im 4/44]
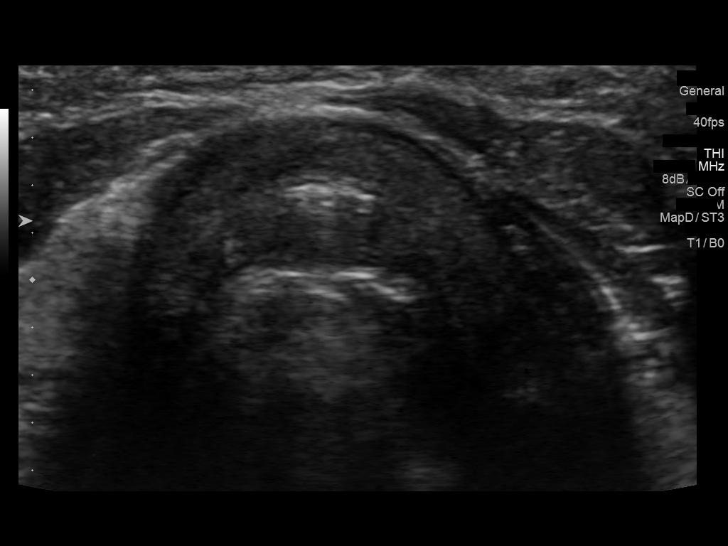
[im 8/44]
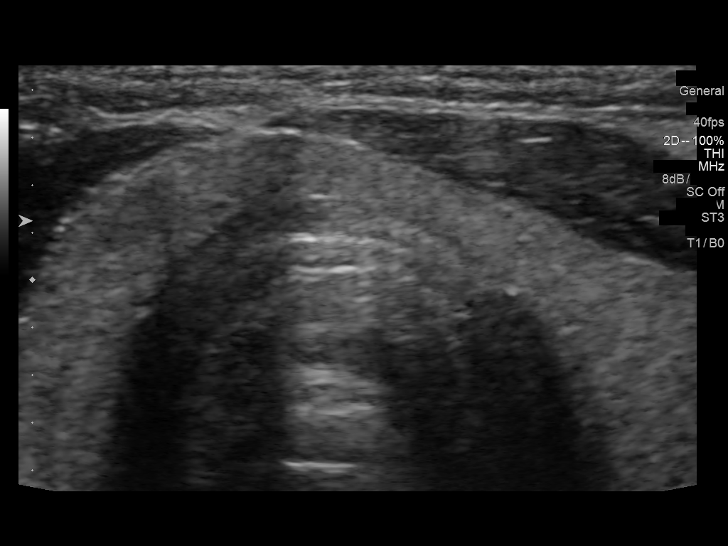
[im 11/44]
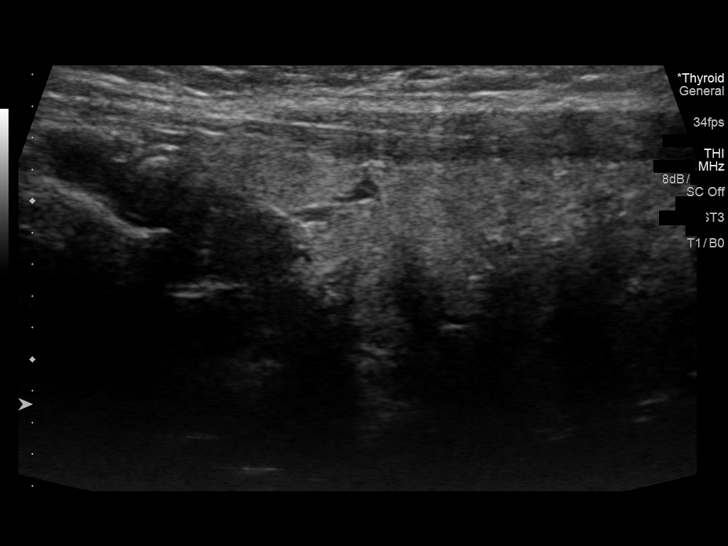
[im 15/44]
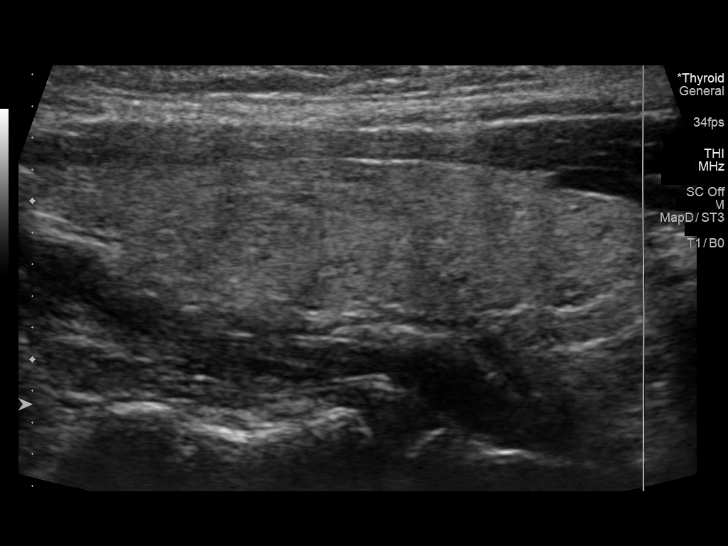
[im 18/44]
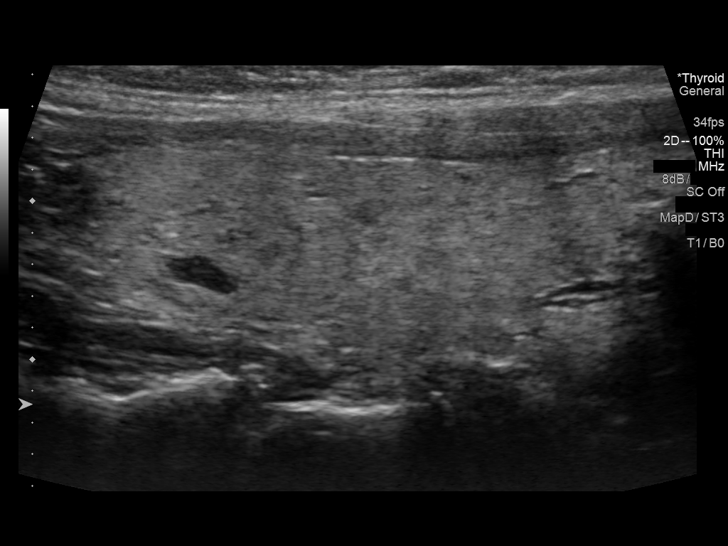
[im 22/44]
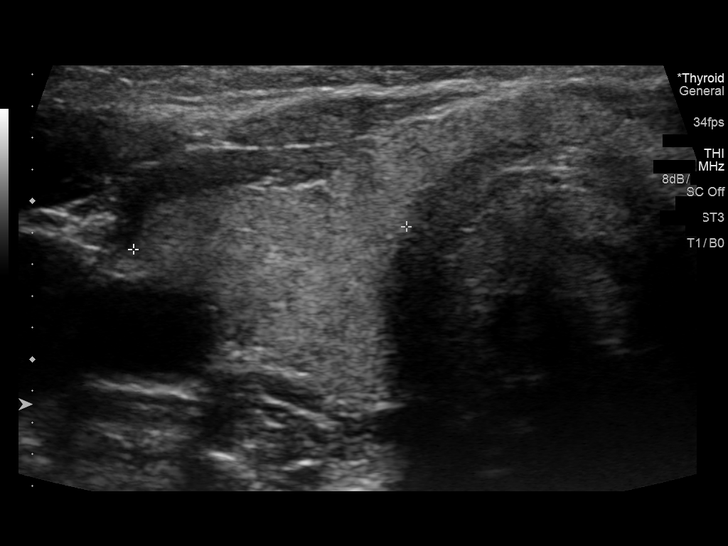
[im 26/44]
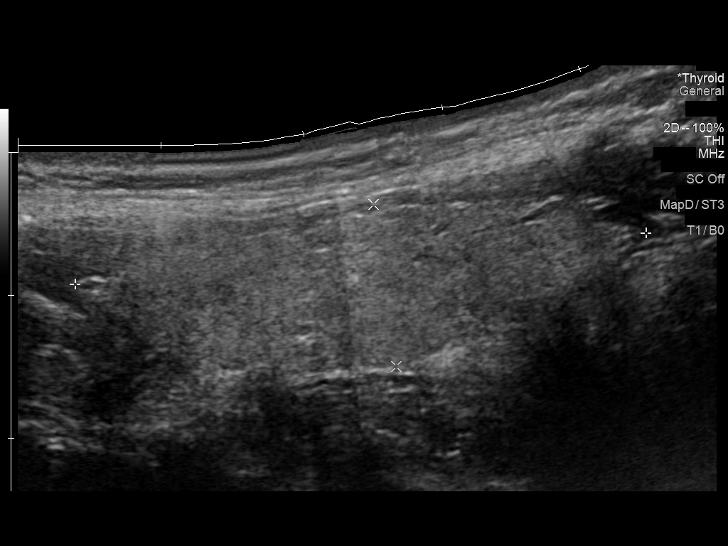
[im 29/44]
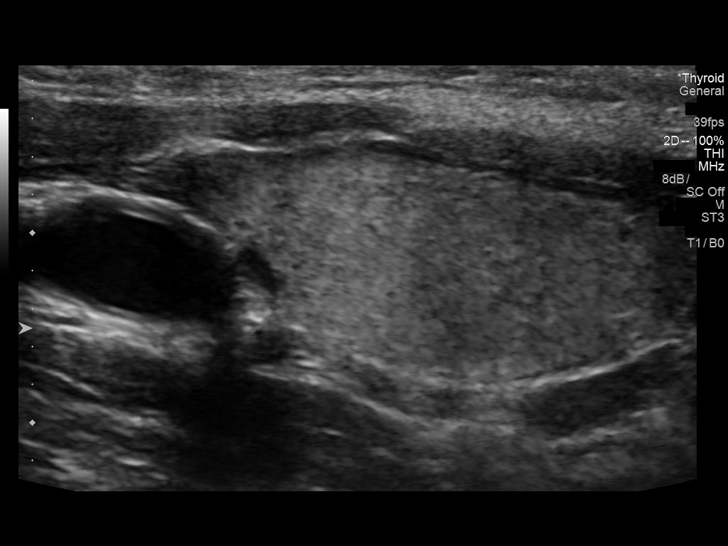
[im 33/44]
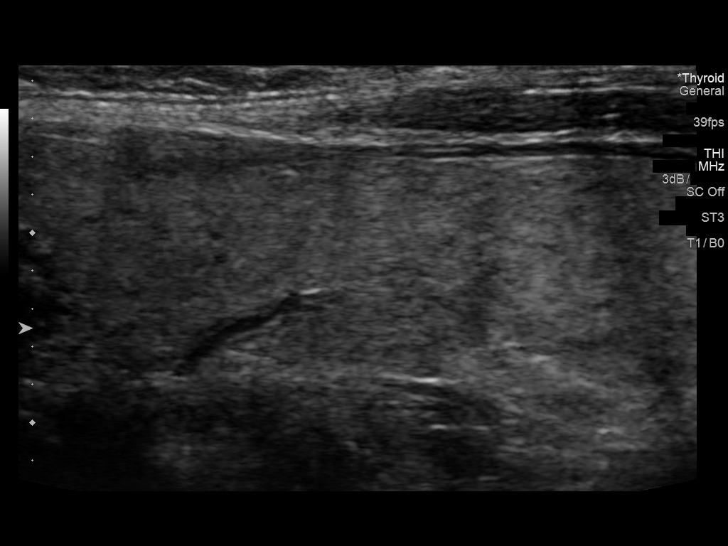
[im 36/44]
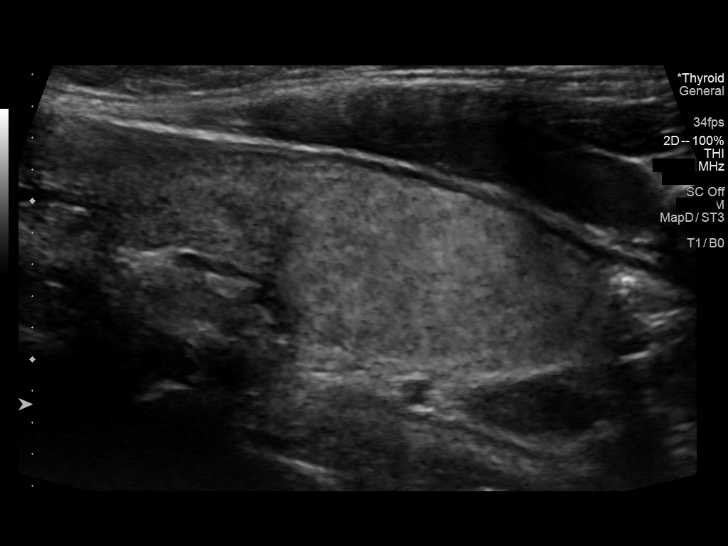
[im 40/44]
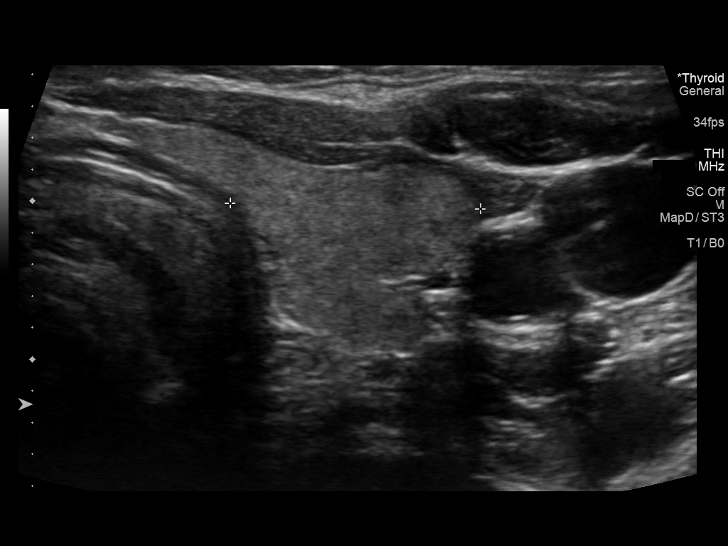
[im 44/44]
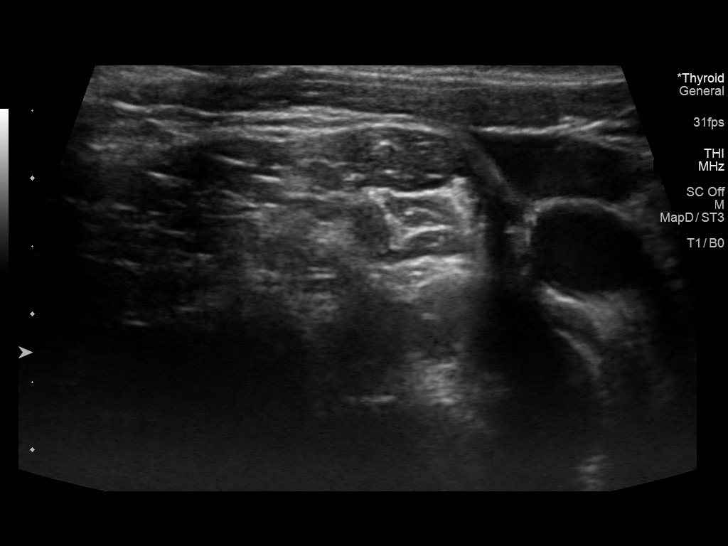

[13 of 25 positions shown; findings below may reference images not displayed]

FINDINGS: Parenchymal Echotexture: Mildly heterogenous

Isthmus: 0.3 cm, previously 0.4 cm

Right lobe: 4.7 x 1.3 x 1.7 cm, previously 4.3 x 1.5 x 1.5 cm

Left lobe: 4.0 x 1.1 x 1.6 cm, previously 4.8 x 1.5 x 1.4 cm

_________________________________________________________

Estimated total number of nodules >/= 1 cm: 1

Number of spongiform nodules >/=  2 cm not described below (TR1): 0

Number of mixed cystic and solid nodules >/= 1.5 cm not described
below (TR2): 0

_________________________________________________________

0.5 cm hypoechoic right upper lobe nodule previously measured 0.9 cm
and does not meet criteria for biopsy or follow-up

0.6 cm right isthmic nodule previously measured 0.5 cm and does not
meet criteria for follow-up or biopsy.

Dominant left lower pole isoechoic nodule measures 2.1 x 1.1 x
cm and previously measured 2.3 x 1.3 x 1.4 cm. This underwent biopsy
07/04/2014 the and is not significantly changed.
IMPRESSION: No significant change.

Small right lobe nodules do not meet criteria for follow-up or
biopsy.

Dominant left lower pole nodule is stable and previously underwent
biopsy. Correlate with prior biopsy results.

The above is in keeping with the ACR TI-RADS recommendations - [HOSPITAL] 4709;[DATE].

## 2019-06-30 ENCOUNTER — Other Ambulatory Visit: Payer: Self-pay | Admitting: Endocrinology

## 2019-06-30 DIAGNOSIS — E039 Hypothyroidism, unspecified: Secondary | ICD-10-CM

## 2019-07-11 ENCOUNTER — Other Ambulatory Visit: Payer: BC Managed Care – PPO

## 2019-07-11 ENCOUNTER — Ambulatory Visit
Admission: RE | Admit: 2019-07-11 | Discharge: 2019-07-11 | Disposition: A | Discharge status: Home / Self Care | Payer: BC Managed Care – PPO | Source: Ambulatory Visit | Attending: Endocrinology | Admitting: Endocrinology

## 2019-07-11 DIAGNOSIS — E039 Hypothyroidism, unspecified: Secondary | ICD-10-CM

## 2020-01-12 ENCOUNTER — Ambulatory Visit: Payer: BC Managed Care – PPO | Attending: Family

## 2020-01-12 DIAGNOSIS — Z23 Encounter for immunization: Secondary | ICD-10-CM | POA: Insufficient documentation

## 2020-01-12 NOTE — Progress Notes (Signed)
   Covid-19 Vaccination Clinic  Name:  Romey Cohea    MRN: [id number] DOB: 24-Jan-1976  01/12/2020  Ms. Smylie was observed post Covid-19 immunization for 15 minutes without incidence. She was provided with Vaccine Information Sheet and instruction to access the V-Safe system.   Ms. Hollar was instructed to call 911 with any severe reactions post vaccine: Marland Kitchen Difficulty breathing  . Swelling of your face and throat  . A fast heartbeat  . A bad rash all over your body  . Dizziness and weakness    Immunizations Administered    Name Date Dose VIS Date Route   Moderna COVID-19 Vaccine 01/12/2020 11:11 AM 0.5 mL 11/01/2019 Intramuscular   Manufacturer: Moderna   Lot: [id number]   NDC: 95396-728-97

## 2020-02-14 ENCOUNTER — Ambulatory Visit: Payer: BC Managed Care – PPO | Attending: Family

## 2020-02-14 DIAGNOSIS — Z23 Encounter for immunization: Secondary | ICD-10-CM

## 2020-02-14 NOTE — Progress Notes (Signed)
   Covid-19 Vaccination Clinic  Name:  Mattelyn Imhoff    MRN: [id number] DOB: 1976-01-20  02/14/2020  Ms. Mcglothlin was observed post Covid-19 immunization for 15 minutes without incident. She was provided with Vaccine Information Sheet and instruction to access the V-Safe system.   Ms. Hammill was instructed to call 911 with any severe reactions post vaccine: Marland Kitchen Difficulty breathing  . Swelling of face and throat  . A fast heartbeat  . A bad rash all over body  . Dizziness and weakness   Immunizations Administered    Name Date Dose VIS Date Route   Moderna COVID-19 Vaccine 02/14/2020 10:49 AM 0.5 mL 11/01/2019 Intramuscular   Manufacturer: Moderna   Lot: [id number]   NDC: 45733-448-30

## 2020-03-21 IMAGING — US US THYROID
1 series · 13 of 25 positions shown · non-contrast
Comparison: 08/12/2017, 06/23/2014

CLINICAL DATA: Hypothyroidism, nodules. Previous FNA biopsy of left
nodule 07/04/2014.

EXAM:
THYROID ULTRASOUND
TECHNIQUE: Ultrasound examination of the thyroid gland and adjacent soft
tissues was performed.

[Series 1: us thyroid · 0.07mm/px · 13 of 43 slices shown]
[im 1/43]
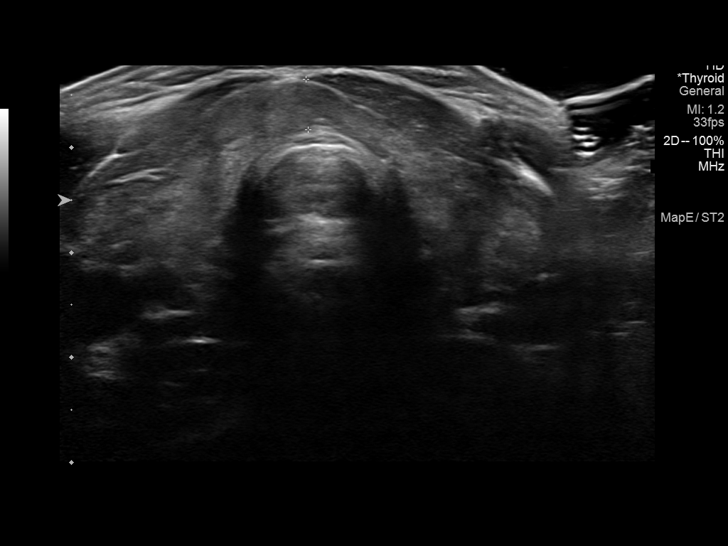
[im 4/43]
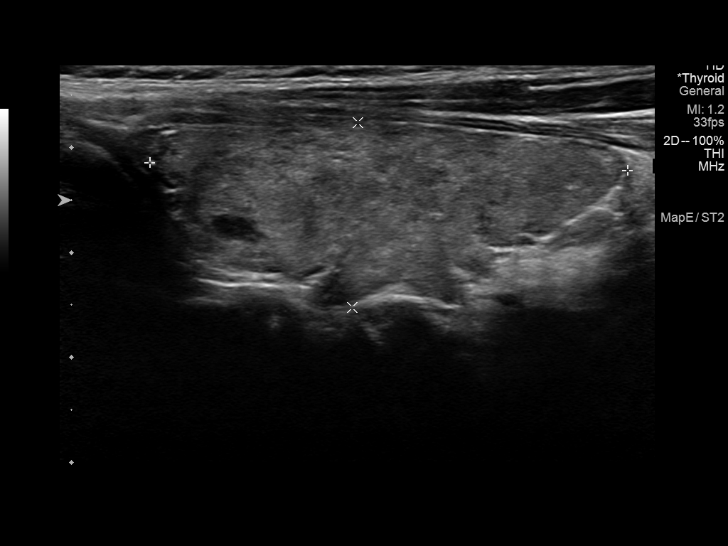
[im 8/43]
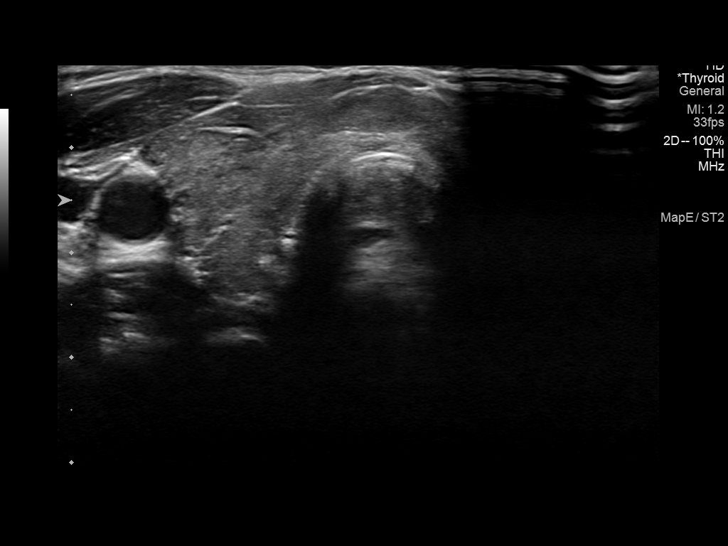
[im 11/43]
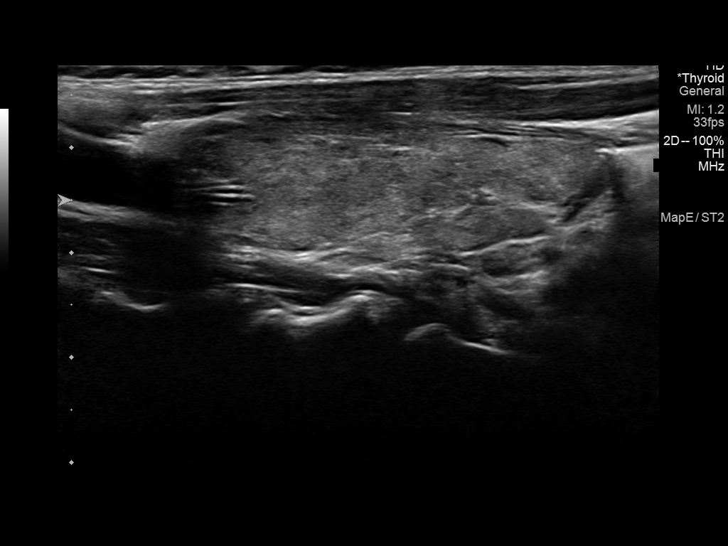
[im 15/43]
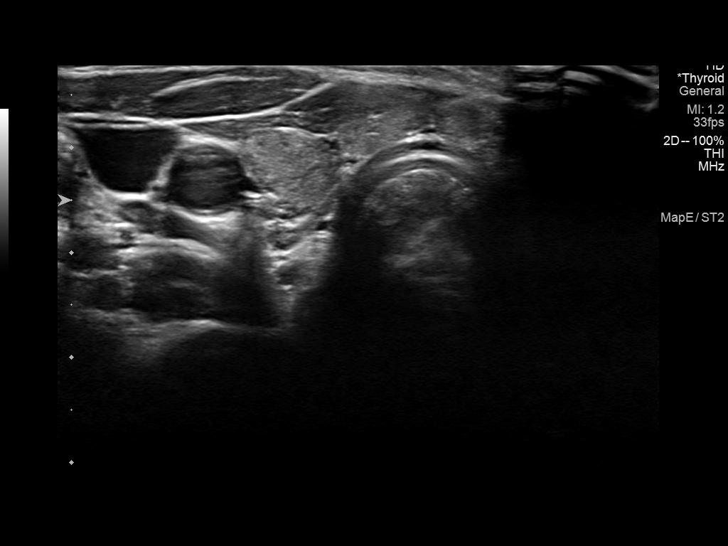
[im 18/43]
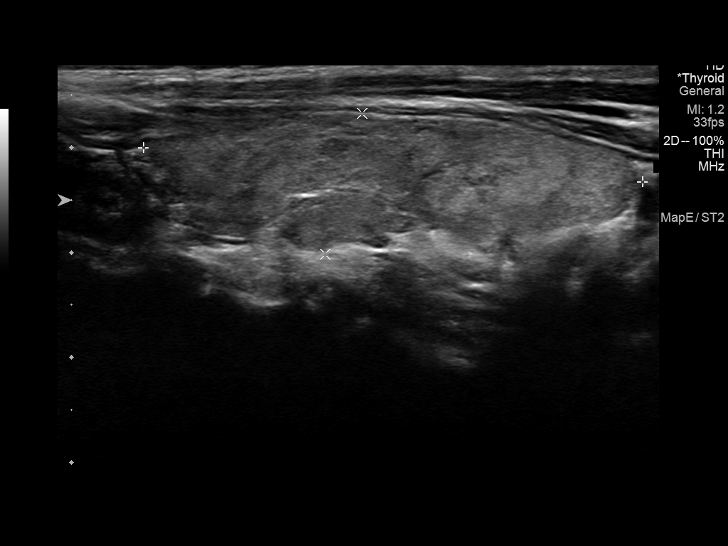
[im 22/43]
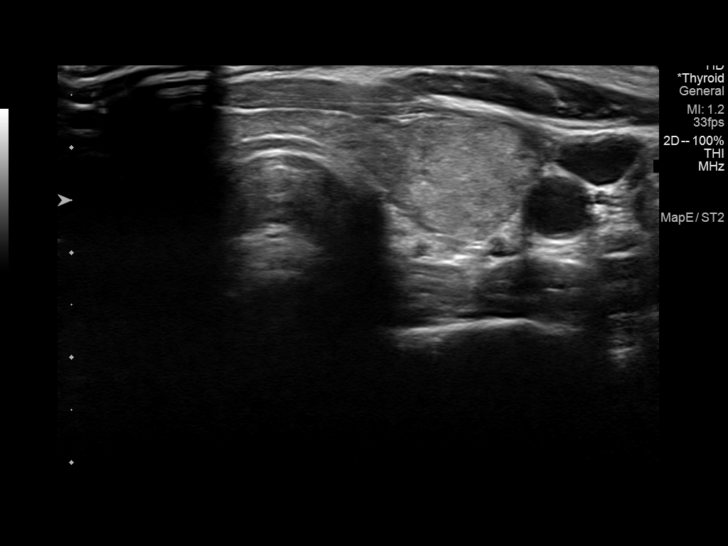
[im 25/43]
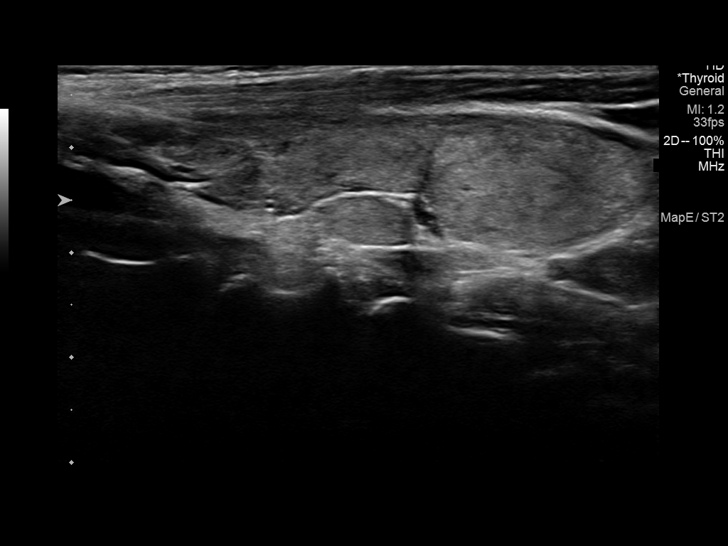
[im 29/43]
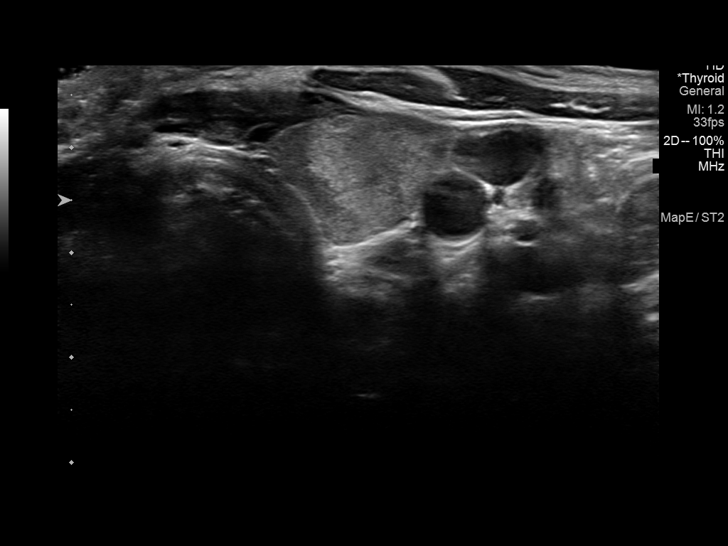
[im 32/43]
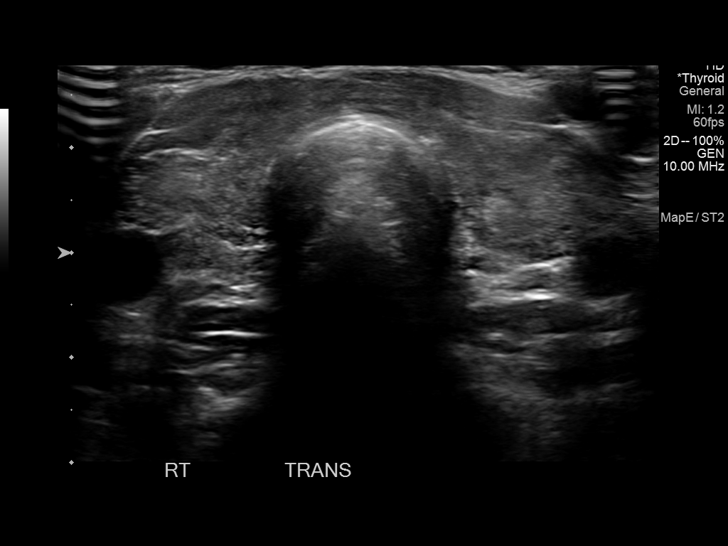
[im 36/43]
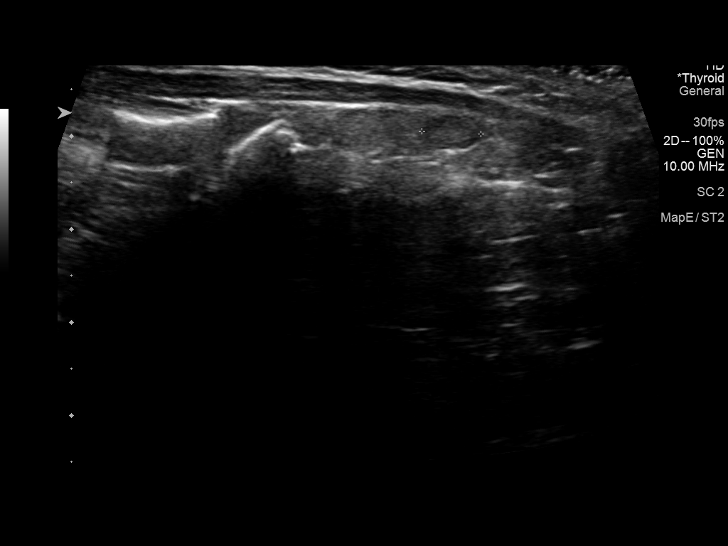
[im 39/43]
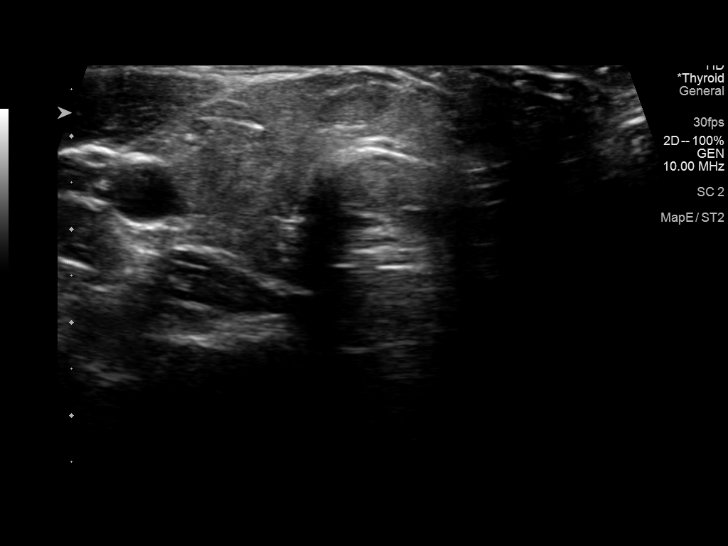
[im 43/43]
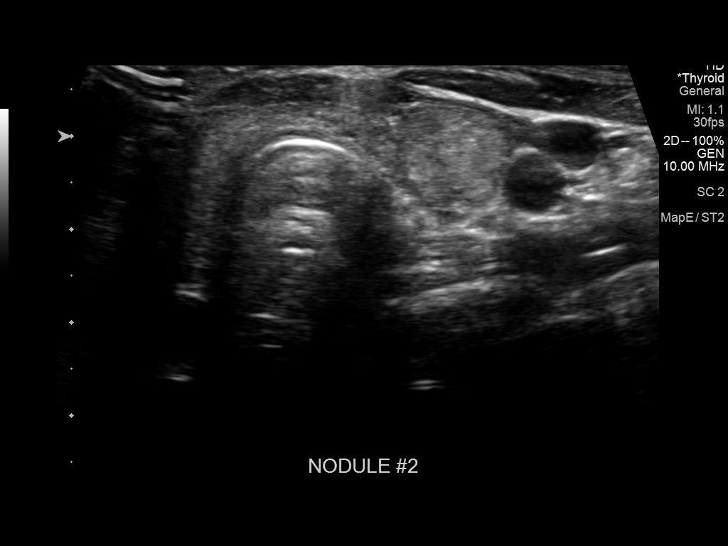

[13 of 25 positions shown; findings below may reference images not displayed]

FINDINGS: Parenchymal Echotexture: Moderately heterogenous, hyperemic

Isthmus: 0.5 cm thickness, previously

Right lobe: 4.6 x 1.8 x 1.6 cm, previously 4.7 x 1.3 x

Left lobe: 4.8 x 1.4 x 1.8 cm, previously 4 x 1.1 x

_________________________________________________________

Estimated total number of nodules >/= 1 cm: 1

Number of spongiform nodules >/=  2 cm not described below (TR1): 0

Number of mixed cystic and solid nodules >/= 1.5 cm not described
below (TR2): 0

_________________________________________________________

0.6 cm hypoechoic isthmic nodule without calcifications, stable;
This nodule does NOT meet TI-RADS criteria for biopsy or dedicated
follow-up.

1.9 x 1 cm inferior left nodule, previously 2.3 x 1.3 x 1.4 on
06/23/2014; stability for greater than 5 years implies benignity;
this was previously biopsied.
IMPRESSION: 1. Heterogenous thyroid with stable  nodules.
None meets criteria for biopsy or dedicated imaging follow-up.

The above is in keeping with the ACR TI-RADS recommendations - [HOSPITAL] 1330;[DATE].

## 2020-10-04 ENCOUNTER — Ambulatory Visit: Payer: BC Managed Care – PPO | Attending: Family

## 2020-10-04 DIAGNOSIS — Z23 Encounter for immunization: Secondary | ICD-10-CM

## 2020-12-18 NOTE — Progress Notes (Signed)
   Covid-19 Vaccination Clinic  Name:  Gina Ward    MRN: [id number] DOB: Dec 25, 1975  12/18/2020  [name] was observed post Covid-19 immunization for 15 minutes without incident. She was provided with Vaccine Information Sheet and instruction to access the V-Safe system.   [name] was instructed to call 911 with any severe reactions post vaccine: Marland Kitchen Difficulty breathing  . Swelling of face and throat  . A fast heartbeat  . A bad rash all over body  . Dizziness and weakness   Immunizations Administered    Name Date Dose VIS Date Route   Moderna Covid-19 Booster Vaccine 10/04/2020  8:15 AM 0.25 mL 09/19/2020 Intramuscular   Manufacturer: Moderna   Lot: [id number]   NDC: 59977-414-23

## 2021-12-06 ENCOUNTER — Ambulatory Visit: Payer: Self-pay | Attending: Family

## 2021-12-06 DIAGNOSIS — Z23 Encounter for immunization: Secondary | ICD-10-CM

## 2021-12-06 NOTE — Progress Notes (Signed)
° °  Covid-19 Vaccination Clinic  Name:  Gina Ward    MRN: [id number] DOB: 06-28-1976  12/06/2021  Gina Ward was observed post Covid-19 immunization for 15 minutes without incident. She was provided with Vaccine Information Sheet and instruction to access the V-Safe system.   Gina Ward was instructed to call 911 with any severe reactions post vaccine: Difficulty breathing  Swelling of face and throat  A fast heartbeat  A bad rash all over body  Dizziness and weakness   Immunizations Administered     Name Date Dose VIS Date Route   Moderna Covid-19 vaccine Bivalent Booster 12/06/2021  2:30 PM 0.5 mL 07/13/2021 Intramuscular   Manufacturer: Moderna   Lot: [id number]   NDC: 75170-017-49

## 2022-05-20 ENCOUNTER — Other Ambulatory Visit: Payer: Self-pay | Admitting: Endocrinology

## 2022-05-20 DIAGNOSIS — E049 Nontoxic goiter, unspecified: Secondary | ICD-10-CM

## 2022-07-14 ENCOUNTER — Other Ambulatory Visit: Payer: Self-pay | Admitting: Endocrinology

## 2022-07-14 DIAGNOSIS — E049 Nontoxic goiter, unspecified: Secondary | ICD-10-CM

## 2022-07-17 ENCOUNTER — Ambulatory Visit
Admission: RE | Admit: 2022-07-17 | Discharge: 2022-07-17 | Disposition: A | Discharge status: Home / Self Care | Payer: BC Managed Care – PPO | Source: Ambulatory Visit | Attending: Endocrinology | Admitting: Endocrinology

## 2022-07-17 DIAGNOSIS — E049 Nontoxic goiter, unspecified: Secondary | ICD-10-CM

## 2022-08-20 ENCOUNTER — Other Ambulatory Visit: Payer: Self-pay | Admitting: Endocrinology

## 2022-08-20 DIAGNOSIS — E049 Nontoxic goiter, unspecified: Secondary | ICD-10-CM

## 2023-02-11 ENCOUNTER — Ambulatory Visit
Admission: RE | Admit: 2023-02-11 | Discharge: 2023-02-11 | Disposition: A | Discharge status: Home / Self Care | Payer: BC Managed Care – PPO | Source: Ambulatory Visit | Attending: Endocrinology | Admitting: Endocrinology

## 2023-02-11 ENCOUNTER — Other Ambulatory Visit: Payer: BC Managed Care – PPO

## 2023-02-11 DIAGNOSIS — E049 Nontoxic goiter, unspecified: Secondary | ICD-10-CM

## 2023-04-03 ENCOUNTER — Other Ambulatory Visit: Payer: Self-pay | Admitting: Endocrinology

## 2023-04-03 DIAGNOSIS — E049 Nontoxic goiter, unspecified: Secondary | ICD-10-CM

## 2023-05-01 ENCOUNTER — Ambulatory Visit
Admission: RE | Admit: 2023-05-01 | Discharge: 2023-05-01 | Disposition: A | Discharge status: Home / Self Care | Payer: BC Managed Care – PPO | Source: Ambulatory Visit | Attending: Endocrinology | Admitting: Endocrinology

## 2023-05-01 ENCOUNTER — Other Ambulatory Visit (HOSPITAL_COMMUNITY)
Admission: RE | Admit: 2023-05-01 | Discharge: 2023-05-01 | Disposition: A | Discharge status: Home / Self Care | Payer: BC Managed Care – PPO | Source: Ambulatory Visit | Attending: Endocrinology | Admitting: Endocrinology

## 2023-05-01 DIAGNOSIS — E049 Nontoxic goiter, unspecified: Secondary | ICD-10-CM | POA: Diagnosis present

## 2023-05-05 LAB — CYTOLOGY - NON PAP
